# Patient Record
Sex: Female | Born: 1980
Health system: Southern US, Community
[De-identification: ages and names within clinical notes are randomized; demographics above are authoritative.]

## PROBLEM LIST (undated history)

## (undated) ENCOUNTER — Inpatient Hospital Stay (HOSPITAL_COMMUNITY): Payer: Self-pay

## (undated) DIAGNOSIS — H3560 Retinal hemorrhage, unspecified eye: Secondary | ICD-10-CM

## (undated) DIAGNOSIS — O24919 Unspecified diabetes mellitus in pregnancy, unspecified trimester: Secondary | ICD-10-CM

## (undated) DIAGNOSIS — G610 Guillain-Barre syndrome: Secondary | ICD-10-CM

## (undated) DIAGNOSIS — M310 Hypersensitivity angiitis: Secondary | ICD-10-CM

## (undated) DIAGNOSIS — E282 Polycystic ovarian syndrome: Secondary | ICD-10-CM

## (undated) DIAGNOSIS — R739 Hyperglycemia, unspecified: Secondary | ICD-10-CM

## (undated) DIAGNOSIS — Q893 Situs inversus: Secondary | ICD-10-CM

## (undated) HISTORY — DX: Hyperglycemia, unspecified: R73.9

## (undated) HISTORY — DX: Guillain-Barre syndrome: G61.0

## (undated) HISTORY — DX: Retinal hemorrhage, unspecified eye: H35.60

## (undated) HISTORY — DX: Unspecified diabetes mellitus in pregnancy, unspecified trimester: O24.919

## (undated) HISTORY — DX: Polycystic ovarian syndrome: E28.2

## (undated) HISTORY — DX: Hypersensitivity angiitis: M31.0

## (undated) HISTORY — DX: Situs inversus: Q89.3

---

## 1999-02-17 ENCOUNTER — Other Ambulatory Visit: Admission: RE | Admit: 1999-02-17 | Discharge: 1999-02-17 | Payer: Self-pay | Admitting: Obstetrics and Gynecology

## 2000-09-29 ENCOUNTER — Other Ambulatory Visit: Admission: RE | Admit: 2000-09-29 | Discharge: 2000-09-29 | Payer: Self-pay | Admitting: Obstetrics and Gynecology

## 2003-05-13 ENCOUNTER — Other Ambulatory Visit: Admission: RE | Admit: 2003-05-13 | Discharge: 2003-05-13 | Payer: Self-pay | Admitting: Obstetrics and Gynecology

## 2004-06-17 ENCOUNTER — Other Ambulatory Visit: Admission: RE | Admit: 2004-06-17 | Discharge: 2004-06-17 | Payer: Self-pay | Admitting: Obstetrics and Gynecology

## 2004-09-27 ENCOUNTER — Ambulatory Visit: Payer: Self-pay | Admitting: Family Medicine

## 2005-04-19 ENCOUNTER — Ambulatory Visit: Payer: Self-pay | Admitting: Family Medicine

## 2005-04-26 ENCOUNTER — Ambulatory Visit: Payer: Self-pay | Admitting: Family Medicine

## 2005-05-03 ENCOUNTER — Ambulatory Visit: Payer: Self-pay | Admitting: Family Medicine

## 2005-07-01 ENCOUNTER — Other Ambulatory Visit: Admission: RE | Admit: 2005-07-01 | Discharge: 2005-07-01 | Payer: Self-pay | Admitting: Obstetrics and Gynecology

## 2005-08-04 ENCOUNTER — Ambulatory Visit: Payer: Self-pay | Admitting: Family Medicine

## 2005-08-30 ENCOUNTER — Ambulatory Visit: Payer: Self-pay | Admitting: Family Medicine

## 2005-10-04 ENCOUNTER — Ambulatory Visit: Payer: Self-pay | Admitting: Family Medicine

## 2006-02-27 ENCOUNTER — Ambulatory Visit: Payer: Self-pay | Admitting: Family Medicine

## 2006-07-12 ENCOUNTER — Ambulatory Visit: Payer: Self-pay | Admitting: Internal Medicine

## 2006-07-14 ENCOUNTER — Ambulatory Visit: Payer: Self-pay | Admitting: Family Medicine

## 2007-03-21 ENCOUNTER — Emergency Department (HOSPITAL_COMMUNITY): Admission: EM | Admit: 2007-03-21 | Discharge: 2007-03-22 | Payer: Self-pay | Admitting: Emergency Medicine

## 2007-03-22 ENCOUNTER — Telehealth: Payer: Self-pay | Admitting: Family Medicine

## 2007-03-25 ENCOUNTER — Emergency Department (HOSPITAL_COMMUNITY): Admission: EM | Admit: 2007-03-25 | Discharge: 2007-03-25 | Payer: Self-pay | Admitting: Emergency Medicine

## 2007-03-29 ENCOUNTER — Encounter (HOSPITAL_COMMUNITY): Admission: RE | Admit: 2007-03-29 | Discharge: 2007-06-26 | Payer: Self-pay | Admitting: Emergency Medicine

## 2007-04-04 ENCOUNTER — Telehealth: Payer: Self-pay | Admitting: Family Medicine

## 2007-06-04 DIAGNOSIS — J069 Acute upper respiratory infection, unspecified: Secondary | ICD-10-CM | POA: Insufficient documentation

## 2007-06-05 ENCOUNTER — Ambulatory Visit: Payer: Self-pay | Admitting: Family Medicine

## 2007-07-04 ENCOUNTER — Telehealth (INDEPENDENT_AMBULATORY_CARE_PROVIDER_SITE_OTHER): Payer: Self-pay | Admitting: *Deleted

## 2007-07-05 ENCOUNTER — Ambulatory Visit: Payer: Self-pay | Admitting: Family Medicine

## 2007-07-05 DIAGNOSIS — Q893 Situs inversus: Secondary | ICD-10-CM

## 2007-07-05 DIAGNOSIS — R259 Unspecified abnormal involuntary movements: Secondary | ICD-10-CM | POA: Insufficient documentation

## 2007-07-05 DIAGNOSIS — R071 Chest pain on breathing: Secondary | ICD-10-CM

## 2007-07-05 DIAGNOSIS — D1739 Benign lipomatous neoplasm of skin and subcutaneous tissue of other sites: Secondary | ICD-10-CM

## 2007-07-24 ENCOUNTER — Telehealth: Payer: Self-pay | Admitting: Family Medicine

## 2007-08-03 ENCOUNTER — Telehealth (INDEPENDENT_AMBULATORY_CARE_PROVIDER_SITE_OTHER): Payer: Self-pay | Admitting: *Deleted

## 2007-08-07 ENCOUNTER — Telehealth (INDEPENDENT_AMBULATORY_CARE_PROVIDER_SITE_OTHER): Payer: Self-pay | Admitting: *Deleted

## 2007-09-05 ENCOUNTER — Telehealth (INDEPENDENT_AMBULATORY_CARE_PROVIDER_SITE_OTHER): Payer: Self-pay | Admitting: *Deleted

## 2007-09-11 ENCOUNTER — Ambulatory Visit: Payer: Self-pay | Admitting: Cardiology

## 2007-10-01 ENCOUNTER — Ambulatory Visit: Payer: Self-pay | Admitting: Cardiology

## 2007-10-01 ENCOUNTER — Encounter: Payer: Self-pay | Admitting: Cardiology

## 2007-10-01 ENCOUNTER — Ambulatory Visit: Payer: Self-pay

## 2007-10-10 ENCOUNTER — Ambulatory Visit: Payer: Self-pay | Admitting: Family Medicine

## 2007-10-10 DIAGNOSIS — L723 Sebaceous cyst: Secondary | ICD-10-CM | POA: Insufficient documentation

## 2007-10-12 ENCOUNTER — Ambulatory Visit (HOSPITAL_COMMUNITY): Admission: RE | Admit: 2007-10-12 | Discharge: 2007-10-12 | Payer: Self-pay | Admitting: Obstetrics and Gynecology

## 2007-12-13 ENCOUNTER — Ambulatory Visit: Payer: Self-pay | Admitting: Family Medicine

## 2008-05-28 ENCOUNTER — Telehealth: Payer: Self-pay | Admitting: Family Medicine

## 2008-10-06 ENCOUNTER — Ambulatory Visit: Payer: Self-pay | Admitting: Family Medicine

## 2008-10-06 DIAGNOSIS — L708 Other acne: Secondary | ICD-10-CM

## 2008-10-06 DIAGNOSIS — J309 Allergic rhinitis, unspecified: Secondary | ICD-10-CM | POA: Insufficient documentation

## 2008-10-08 ENCOUNTER — Telehealth: Payer: Self-pay | Admitting: Family Medicine

## 2008-10-17 ENCOUNTER — Telehealth: Payer: Self-pay | Admitting: Family Medicine

## 2008-12-31 ENCOUNTER — Telehealth: Payer: Self-pay | Admitting: Cardiology

## 2009-01-30 ENCOUNTER — Ambulatory Visit: Payer: Self-pay | Admitting: Cardiology

## 2009-01-30 DIAGNOSIS — Q249 Congenital malformation of heart, unspecified: Secondary | ICD-10-CM

## 2009-02-09 ENCOUNTER — Ambulatory Visit (HOSPITAL_COMMUNITY): Admission: RE | Admit: 2009-02-09 | Discharge: 2009-02-09 | Payer: Self-pay | Admitting: Obstetrics and Gynecology

## 2009-03-20 ENCOUNTER — Telehealth: Payer: Self-pay | Admitting: Cardiology

## 2009-03-30 ENCOUNTER — Ambulatory Visit (HOSPITAL_COMMUNITY): Admission: RE | Admit: 2009-03-30 | Discharge: 2009-03-30 | Payer: Self-pay | Admitting: Obstetrics and Gynecology

## 2009-04-28 ENCOUNTER — Ambulatory Visit (HOSPITAL_COMMUNITY): Admission: RE | Admit: 2009-04-28 | Discharge: 2009-04-28 | Payer: Self-pay | Admitting: Obstetrics and Gynecology

## 2009-06-09 ENCOUNTER — Encounter: Admission: RE | Admit: 2009-06-09 | Discharge: 2009-08-19 | Payer: Self-pay | Admitting: Obstetrics and Gynecology

## 2009-06-11 ENCOUNTER — Ambulatory Visit: Payer: Self-pay | Admitting: Cardiology

## 2009-06-28 ENCOUNTER — Observation Stay (HOSPITAL_COMMUNITY): Admission: AD | Admit: 2009-06-28 | Discharge: 2009-06-29 | Payer: Self-pay | Admitting: Obstetrics and Gynecology

## 2009-07-02 ENCOUNTER — Ambulatory Visit: Payer: Self-pay | Admitting: Family Medicine

## 2009-07-02 ENCOUNTER — Telehealth: Payer: Self-pay | Admitting: Family Medicine

## 2009-07-14 ENCOUNTER — Telehealth: Payer: Self-pay | Admitting: Family Medicine

## 2009-07-17 ENCOUNTER — Ambulatory Visit (HOSPITAL_COMMUNITY): Admission: RE | Admit: 2009-07-17 | Discharge: 2009-07-17 | Payer: Self-pay | Admitting: Obstetrics and Gynecology

## 2009-07-19 ENCOUNTER — Inpatient Hospital Stay (HOSPITAL_COMMUNITY): Admission: AD | Admit: 2009-07-19 | Discharge: 2009-07-19 | Payer: Self-pay | Admitting: Obstetrics and Gynecology

## 2009-08-04 ENCOUNTER — Ambulatory Visit: Payer: Self-pay | Admitting: Cardiology

## 2009-08-11 ENCOUNTER — Inpatient Hospital Stay (HOSPITAL_COMMUNITY): Admission: AD | Admit: 2009-08-11 | Discharge: 2009-08-12 | Payer: Self-pay | Admitting: Obstetrics and Gynecology

## 2009-08-20 ENCOUNTER — Inpatient Hospital Stay (HOSPITAL_COMMUNITY): Admission: RE | Admit: 2009-08-20 | Discharge: 2009-08-23 | Payer: Self-pay | Admitting: Obstetrics and Gynecology

## 2009-08-24 ENCOUNTER — Encounter: Admission: RE | Admit: 2009-08-24 | Discharge: 2009-09-23 | Payer: Self-pay | Admitting: Obstetrics & Gynecology

## 2009-09-24 ENCOUNTER — Encounter: Admission: RE | Admit: 2009-09-24 | Discharge: 2009-10-21 | Payer: Self-pay | Admitting: Obstetrics and Gynecology

## 2009-10-22 ENCOUNTER — Encounter: Admission: RE | Admit: 2009-10-22 | Discharge: 2009-11-21 | Payer: Self-pay | Admitting: Obstetrics and Gynecology

## 2009-11-22 ENCOUNTER — Encounter: Admission: RE | Admit: 2009-11-22 | Discharge: 2009-12-22 | Payer: Self-pay | Admitting: Obstetrics and Gynecology

## 2009-12-23 ENCOUNTER — Encounter: Admission: RE | Admit: 2009-12-23 | Discharge: 2010-01-22 | Payer: Self-pay | Admitting: Obstetrics and Gynecology

## 2010-01-07 ENCOUNTER — Telehealth: Payer: Self-pay | Admitting: Family Medicine

## 2010-01-23 ENCOUNTER — Encounter: Admission: RE | Admit: 2010-01-23 | Discharge: 2010-02-18 | Payer: Self-pay | Admitting: Obstetrics and Gynecology

## 2010-02-03 ENCOUNTER — Encounter: Payer: Self-pay | Admitting: *Deleted

## 2010-02-23 ENCOUNTER — Encounter: Admission: RE | Admit: 2010-02-23 | Discharge: 2010-03-25 | Payer: Self-pay | Admitting: Obstetrics and Gynecology

## 2010-02-24 IMAGING — US US FETAL BPP W/O NONSTRESS
1 series · 5 of 5 positions shown · non-contrast
Comparison: none

OBSTETRICAL ULTRASOUND:
 This ultrasound exam was performed in the [HOSPITAL] Ultrasound Department.  The OB US report was generated in the AS system, and faxed to the ordering physician.  This report is also available in [HOSPITAL]?s AccessANYware and in [REDACTED] PACS.

[Series 1: us fetal bpp w/o nonstress · non-contrast · 5 acquisitions, 5 frames shown]
[im 1/5]
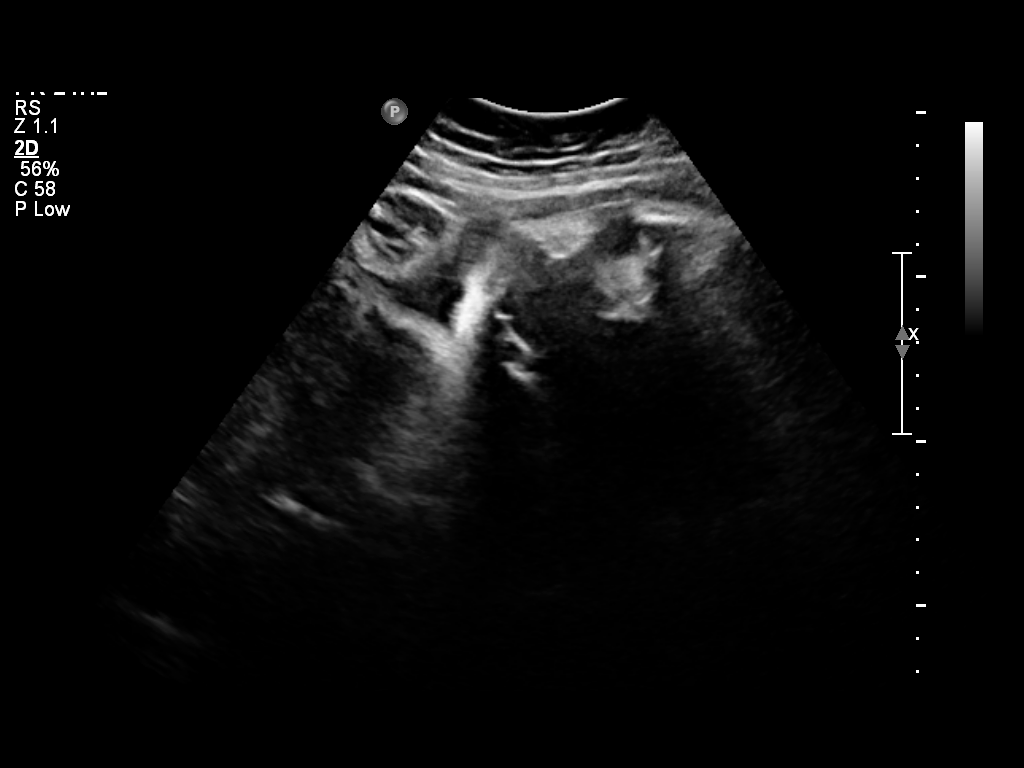
[im 2/5]
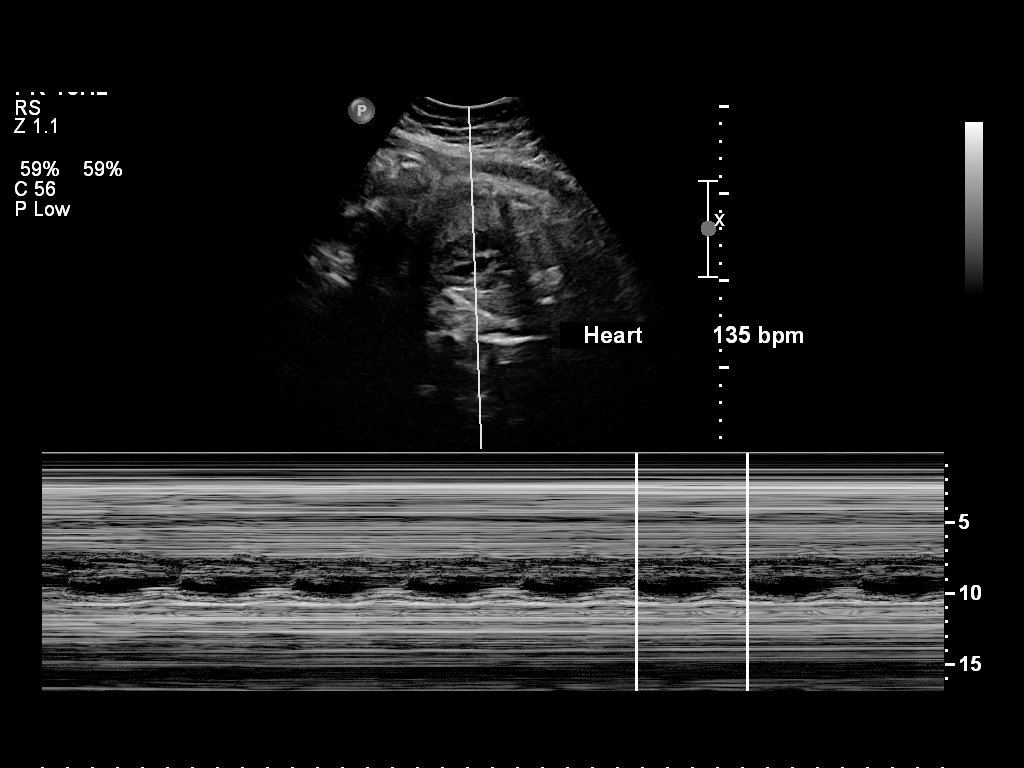
[im 3/5]
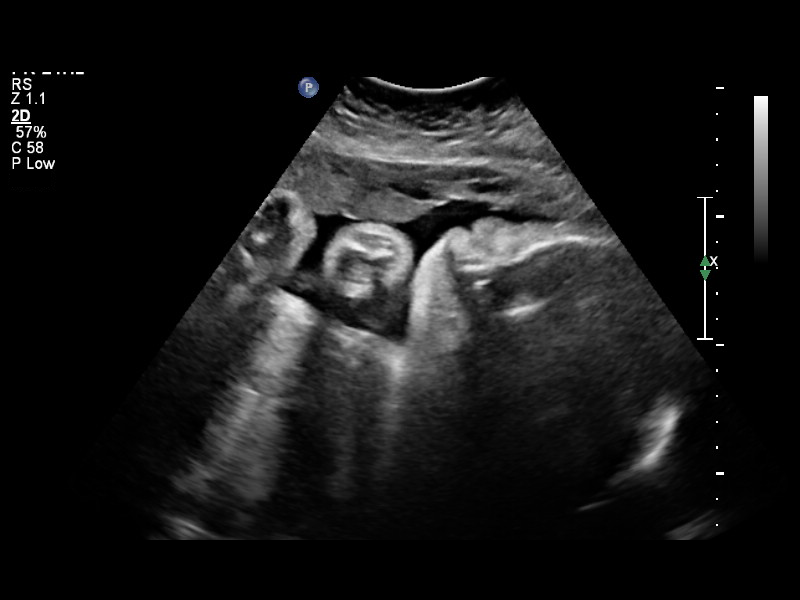
[im 4/5]
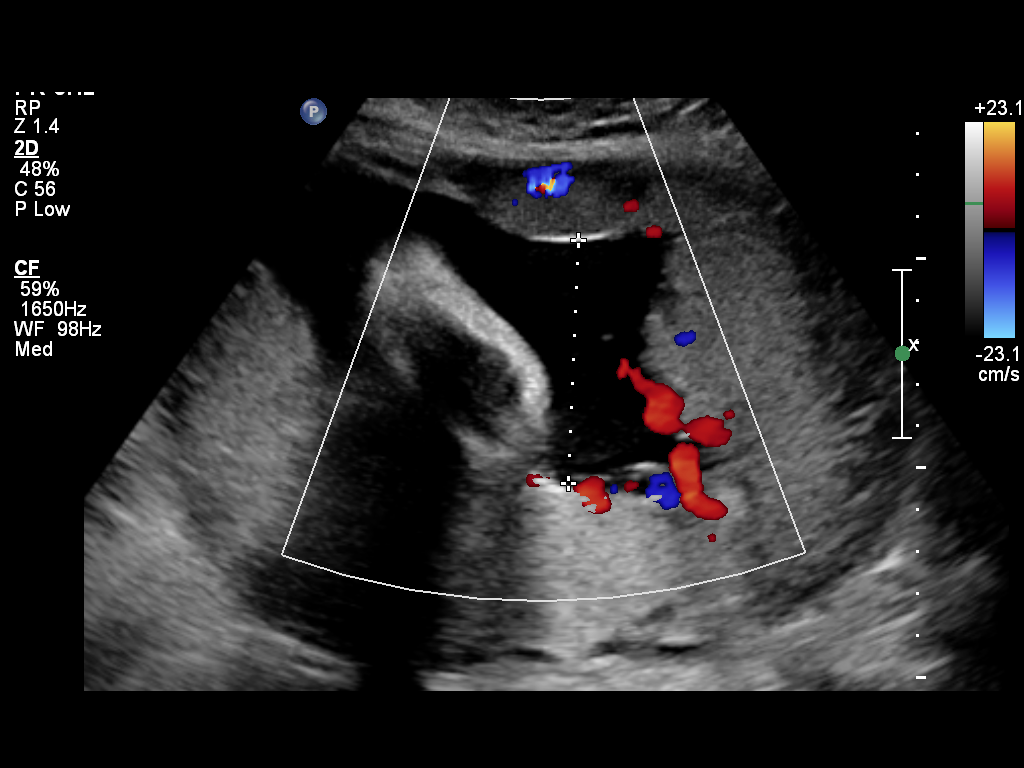
[im 5/5]
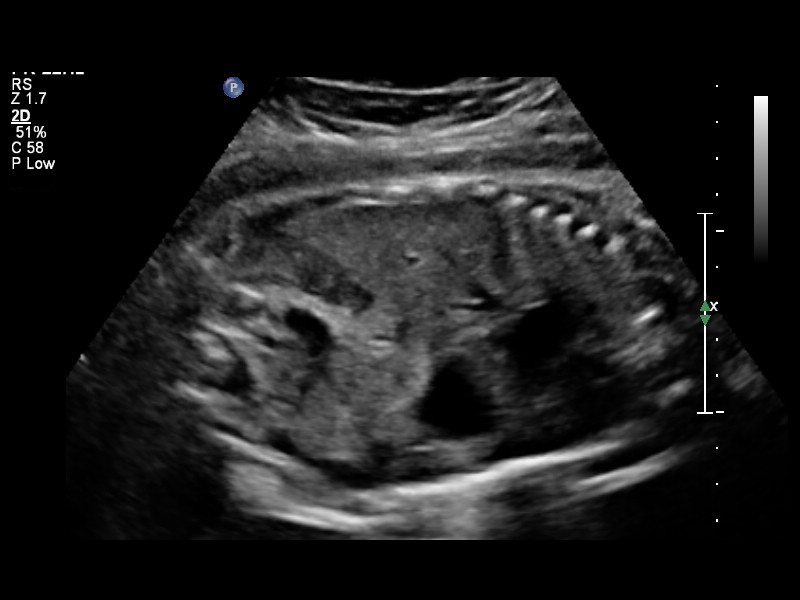

[5 of 5 positions shown; findings below may reference images not displayed]

IMPRESSION: See AS Obstetric US report.

## 2010-03-26 ENCOUNTER — Encounter: Admission: RE | Admit: 2010-03-26 | Discharge: 2010-04-25 | Payer: Self-pay | Admitting: Obstetrics and Gynecology

## 2010-04-26 ENCOUNTER — Encounter: Admission: RE | Admit: 2010-04-26 | Discharge: 2010-05-21 | Payer: Self-pay | Admitting: Obstetrics and Gynecology

## 2010-05-14 ENCOUNTER — Ambulatory Visit: Payer: Self-pay | Admitting: Internal Medicine

## 2010-05-14 ENCOUNTER — Telehealth: Payer: Self-pay | Admitting: Cardiology

## 2010-05-14 DIAGNOSIS — R002 Palpitations: Secondary | ICD-10-CM | POA: Insufficient documentation

## 2010-05-27 ENCOUNTER — Encounter
Admission: RE | Admit: 2010-05-27 | Discharge: 2010-06-26 | Payer: Self-pay | Source: Home / Self Care | Admitting: Obstetrics and Gynecology

## 2010-06-27 ENCOUNTER — Encounter
Admission: RE | Admit: 2010-06-27 | Discharge: 2010-07-27 | Payer: Self-pay | Source: Home / Self Care | Admitting: Obstetrics and Gynecology

## 2010-07-28 ENCOUNTER — Encounter
Admission: RE | Admit: 2010-07-28 | Discharge: 2010-08-16 | Payer: Self-pay | Source: Home / Self Care | Attending: Obstetrics and Gynecology | Admitting: Obstetrics and Gynecology

## 2010-09-13 ENCOUNTER — Encounter: Payer: Self-pay | Admitting: Obstetrics and Gynecology

## 2010-09-21 NOTE — Miscellaneous (Signed)
Summary: immunization update  Clinical Lists Changes  Observations: Added new observation of MMR #2: Historical (01/16/1998 11:18) Added new observation of OPV #4: Historical (04/14/1986 11:18) Added new observation of DPT #5: Historical (04/14/1986 11:18) Added new observation of HEMINFB#1: Historical (03/21/1986 11:18) Added new observation of DPT #4: Historical (11/07/1981 11:18) Added new observation of OPV #3: Historical (11/05/1981 11:18) Added new observation of MMR #1: Historical (08/20/1981 11:18) Added new observation of DPT #3: Historical (10/20/1980 11:18) Added new observation of OPV #2: Historical (07/31/1980 11:18) Added new observation of DPT #2: Historical (07/31/1980 11:18) Added new observation of OPV #1: Historical (06/17/1980 11:18) Added new observation of DPT #1: Historical (06/17/1980 11:18)      Immunization History:  DPT Immunization History:    DPT # 1:  historical (06/17/1980)    DPT # 2:  historical (07/31/1980)    DPT # 3:  historical (10/20/1980)    DPT # 4:  historical (11/07/1981)    DPT # 5:  historical (04/14/1986)  HIB Immunization History:    HIB # 1:  historical (03/21/1986)  Polio Immunization History:    Polio # 1:  historical (06/17/1980)    Polio # 2:  historical (07/31/1980)    Polio # 3:  historical (11/05/1981)    Polio # 4:  historical (04/14/1986)  MMR Immunization History:    MMR # 1:  historical (08/20/1981)    MMR # 2:  historical (01/16/1998)

## 2010-09-21 NOTE — Progress Notes (Signed)
Summary: breastfeeding  Phone Note Call from Patient   Caller: Patient Call For: Roderick Pee MD Summary of Call: Pt. wants to know if it is ok for her to use Nasonex while breastfeeding? [phone] Initial call taken by: Lynann Beaver CMA,  Jan 07, 2010 10:35 AM  Follow-up for Phone Call        ok Follow-up by: Roderick Pee MD,  Jan 07, 2010 10:49 AM  Additional Follow-up for Phone Call Additional follow up Details #1::        Pt. notified. Additional Follow-up by: Lynann Beaver CMA,  Jan 07, 2010 10:55 AM

## 2010-09-21 NOTE — Progress Notes (Signed)
Summary: new rx  Phone Note Call from Patient   Summary of Call: patient is calling because the rx for nasonex is too costly.  she would like to know if there is a safe generic that she can try.  [facility] Initial call taken by: Kern Reap CMA Duncan Dull),  Jan 07, 2010 3:25 PM  Follow-up for Phone Call        Flonase nasal spray, dispensed, one use as directed 6 refills Follow-up by: Roderick Pee MD,  Jan 07, 2010 3:26 PM    New/Updated Medications: FLONASE 50 MCG/ACT SUSP (FLUTICASONE PROPIONATE) use as directed Prescriptions: FLONASE 50 MCG/ACT SUSP (FLUTICASONE PROPIONATE) use as directed  #1 x 6   Entered by:   Kern Reap CMA (AAMA)   Authorized by:   Roderick Pee MD   Signed by:   Kern Reap CMA (AAMA) on 01/07/2010   Method used:   Electronically to        [facility]. [phone]* (retail)       [address]       [place], [place]  [address]       Ph: [phone]       Fax: [phone]   RxID:   [id number]

## 2010-09-21 NOTE — Progress Notes (Signed)
Summary: irregular heart beat  Phone Note Call from Patient   Caller: Patient Reason for Call: Talk to Nurse Complaint: Chest Pain Summary of Call: pt wanting to be seen today for heavy chest, denies chest pains, nausea and dizziness, has a headache, some sob for about 30 min pls advise [phone] Initial call taken by: Glynda Jaeger,  May 14, 2010 8:16 AM  Follow-up for Phone Call        Woke this am with irregular heart beat  lasted about an hour - fluttering lead to a headache   feeling some better.  has happened before when pregnant but hasnt happened in a while. In bed but didnt feel dizzy.  "felt like it was working triple time"  wasn't intense the whole time but felt like she was having trouble getting a deep breath. Follow-up by: Charolotte Capuchin, RN,  May 14, 2010 9:21 AM  Additional Follow-up for Phone Call Additional follow up Details #1::        Patient would need to be worked into the office or if no room go to the ER or urgent care.  I would suggest the latter for EKG if the office is not available.  pt will see Dr Dietrich Pates at 3:15pm  Sander Nephew, RN Additional Follow-up by: Rollene Rotunda, MD, [facility],  May 14, 2010 12:25 PM

## 2010-09-21 NOTE — Assessment & Plan Note (Signed)
Summary: add on per [facility] /jr   Primary Provider:  Roderick Pee MD   History of Present Illness: patient is a 30 year old who is usually followed by J Hochrein.  She has a history of situs inversus She wolke oup this monrning with palpitations.  She has had them in the past, transient.  This morning they felt like isolated skips but they occurred intermittently for 1 hour.  Got up.  Walked around.  No dizziness.  Felt a little heaviness, like her heart was beeting harder.  Since then has felt ok. Denies any excess caffeine.  Getting sleep.  Is active.  No problems with SOB.  No signif palp otherwise.  Did have some during pregnancy.  Current Medications (verified): 1)  Birth Control .... Daily  Allergies (verified): 1)  ! Pcn 2)  ! Amoxicillin 3)  ! Sulfa  Past History:  Past medical, surgical, family and social histories (including risk factors) reviewed, and no changes noted (except as noted below).  Past Medical History: Reviewed history from 06/11/2009 and no changes required. 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  Leukocytoclastic vasculitis.  Hyperglycemia Gestational diabetes  Past Surgical History: Reviewed history from 01/29/2009 and no changes required. None  Family History: Reviewed history from 01/29/2009 and no changes required. Family History of Alcoholism/Addiction Family History of Arthritis Family History Diabetes 1st degree relative Family History Psychiatric care Family History of Stroke M 1st degree relative <50 Family History of Cardiovascular disorder  Social History: Reviewed history from 01/29/2009 and no changes required. Occupation: Never Smoked Alcohol use-no Drug use-no Regular exercise-no Married  Review of Systems       All systems reviewd.  Neg to the above prblem.  Vital Signs:  Patient profile:   30 year old female Height:      65 inches Weight:      186 pounds BMI:      31.06 Pulse rate:   96 / minute Resp:     16 per minute BP sitting:   112 / 68  (left arm)  Vitals Entered By: Marrion Coy, CNA (May 14, 2010 3:44 PM)  Physical Exam  Additional Exam:  Patient in NAD HEENT:  Normocephalic, atraumatic. EOMI, PERRLA.  Neck: JVP is normal. No thyromegaly. No bruits.  Lungs: clear to auscultation. No rales no wheezes.  Heart: Regular rate and rhythm. Normal S1, S2. No S3.   No significant murmurs. PMI/sounds on R side.  Abdomen:  Supple, nontender. Normal bowel sounds. No masses. No hepatomegaly.  Extremities:   Good distal pulses throughout. No lower extremity edema.  Musculoskeletal :moving all extremities.  Neuro:   alert and oriented x3.    Impression & Recommendations:  Problem # 1:  PALPITATIONS (ICD-785.1) patient with isolated palpitations earlier today that came on freq durng 1 hour.  NOne since.  Did not appear to be hemodyn destabilizing.   I reassured patient.  I am not worried that they represent a significant arrhythmia. I would continue activities as tolerated.  If she has a recurrence would set her up for an event monitor to capture.

## 2010-11-22 LAB — CBC
Hemoglobin: 10.7 g/dL — ABNORMAL LOW (ref 12.0–15.0)
Hemoglobin: 12.8 g/dL (ref 12.0–15.0)
RBC: 3.64 MIL/uL — ABNORMAL LOW (ref 3.87–5.11)
RBC: 4.4 MIL/uL (ref 3.87–5.11)
WBC: 14.8 10*3/uL — ABNORMAL HIGH (ref 4.0–10.5)
WBC: 15.9 10*3/uL — ABNORMAL HIGH (ref 4.0–10.5)

## 2010-11-22 LAB — GLUCOSE, CAPILLARY: Glucose-Capillary: 100 mg/dL — ABNORMAL HIGH (ref 70–99)

## 2010-11-22 LAB — RPR: RPR Ser Ql: NONREACTIVE

## 2010-11-24 LAB — URINALYSIS, ROUTINE W REFLEX MICROSCOPIC
Glucose, UA: NEGATIVE mg/dL
Specific Gravity, Urine: 1.01 (ref 1.005–1.030)
Urobilinogen, UA: 0.2 mg/dL (ref 0.0–1.0)
pH: 7 (ref 5.0–8.0)

## 2010-11-24 LAB — DIFFERENTIAL
Eosinophils Absolute: 0 10*3/uL (ref 0.0–0.7)
Lymphs Abs: 1.5 10*3/uL (ref 0.7–4.0)
Neutrophils Relative %: 83 % — ABNORMAL HIGH (ref 43–77)

## 2010-11-24 LAB — CBC
MCV: 85.7 fL (ref 78.0–100.0)
Platelets: 253 10*3/uL (ref 150–400)
WBC: 13.2 10*3/uL — ABNORMAL HIGH (ref 4.0–10.5)

## 2010-11-24 LAB — GLUCOSE, CAPILLARY: Glucose-Capillary: 95 mg/dL (ref 70–99)

## 2010-11-24 LAB — URINE MICROSCOPIC-ADD ON

## 2010-11-29 LAB — ANA: Anti Nuclear Antibody(ANA): NEGATIVE

## 2010-11-29 LAB — LUPUS ANTICOAGULANT PANEL
DRVVT: 43.8 secs (ref 36.1–47.0)
Lupus Anticoagulant: NOT DETECTED

## 2010-11-29 LAB — CARDIOLIPIN ANTIBODIES, IGG, IGM, IGA: Anticardiolipin IgG: <7 [GPL'U] — ABNORMAL LOW (ref <11)

## 2010-12-20 ENCOUNTER — Encounter: Payer: Self-pay | Admitting: Family Medicine

## 2010-12-20 ENCOUNTER — Ambulatory Visit (INDEPENDENT_AMBULATORY_CARE_PROVIDER_SITE_OTHER): Payer: BC Managed Care – PPO | Admitting: Family Medicine

## 2010-12-20 VITALS — BP 110/70 | Temp 98.7°F | Ht 65.0 in | Wt 215.0 lb

## 2010-12-20 DIAGNOSIS — J301 Allergic rhinitis due to pollen: Secondary | ICD-10-CM | POA: Insufficient documentation

## 2010-12-20 MED ORDER — FLUTICASONE PROPIONATE 50 MCG/ACT NA SUSP: 1.0000 | Freq: Every day | NASAL | Status: DC

## 2010-12-20 NOTE — Patient Instructions (Signed)
Begin Zyrtec, plain, 10 mg nightly, and one shot of the steroid nasal spray up each nostril at bedtime.  If in two to 3 weeks.  She does see much improvement.  Call and we will e-mail a short course of the prednisone tablets

## 2010-12-20 NOTE — Progress Notes (Signed)
  Subjective:    Patient ID: Susan Andrade, female    DOB: 1980-12-06, 30 y.o.   MRN: [id number]  HPI  Susan Andrade is a 30 year old female, married, nonsmoker G1, P1, who comes in today for evaluation of allergic rhinitis.  She has perennial allergic rhinitis that typically is worse in the fall and spring.  She's taken OTC Claritin now.  Her symptoms are gotten worse.  Head congestion, postnasal drip, cough, no wheezing    Review of Systems    General and immunologic view is systems otherwise negative Objective:   Physical Exam    Well-developed well-nourished, in no acute distress.  HEENT negative except for 3+ nasal edema.  Neck was supple.  No adenopathy.  Lungs are clear    Assessment & Plan:  Allergic rhinitis,,,,,,,,,,, switch to Zyrtec 10 mg plane nightly also, steroid nasal spray

## 2011-01-04 NOTE — Assessment & Plan Note (Signed)
[facility]                                 ON-CALL NOTE   NAME:Susan, Andrade                           MRN:          [id number]  DATE:03/21/2007                            DOB:          1981-06-25    TIME:  9:52 p.m.   PHONE NUMBER:  [phone], caller was Derrel Nip, the mother.   OBJECTIVE:  The patient is unsure what, but had something fly in her  face, and was told that it was fluttering like a butterfly.  They  presumed it was probably a bat, I am not sure where she was, but  everyone seems to think that it probably was a bat that flew into her  face.  She is unable to tell whether she was bitten or not.   ASSESSMENT:  Possible interaction with a bat.   PLAN:  Would suggest she go to the emergency room and try and get this  sorted out.  May very well need to call [facility] [employer] to see what  they suggest, but I am sure that if they feel she has had an interaction  with a bat, will probably needs rabies prophylaxis.  The patient has had  Guillain-Barre and vasculitis which compounds the problem, but probably  will not change the outcome.  Again, I think the emergency room would be  able to sort this out.   PRIMARY CARE [place] Maclellan:  Dr. Tawanna Cooler.   HOME OFFICE:  [facility].     Susan Silence, Susan Andrade  Electronically Signed    RNS/MedQ  DD: 03/21/2007  DT: 03/22/2007  Job #: [id number]

## 2011-01-04 NOTE — Procedures (Signed)
[facility]                              EXERCISE TREADMILL   Susan Andrade, Susan Andrade                       MRN:          [id number]  DATE:10/01/2007                            DOB:          [birth year]    PRIMARY CARE PHYSICIAN:  Tinnie Gens A. Tawanna Cooler, MD   PROCEDURE:  Exercise treadmill test.   INDICATION:  Evaluate patient with atypical chest discomfort and situs  inversus.   PROCEDURE NOTE:  The patient was exercised using standard Bruce  protocol.  We did hook the leads up to the right side, given her situs  inversus.  There ws some difficulty in tacking blood pressure.  Her  blood pressures were somewhat fluctuating during the exam and I did not  think were entirely accurate.  However, I wanted to complete the  examination and follow the heart rate and EKG changes.  Patient was  exercised using standard Bruce protocol.  She was able to exercise for 9  minutes.  This completed stage 3.  She achieved 10.4 METS.  She had a  peak heart rate of 193, which was 100% of predicted.  It was stopped  because she had achieved target heart rate, we had difficulty following  the blood pressures and she had fatigue.  She denied any chest pain.  She had appropriate shortness of breath.  She had no ischemic ST-T wave  changes.  There were no arrhythmias.  She had a normal heart rate  recovery.  Of note, she did start with some T-wave inversion in her  anterior leads.  This had been noticed on previous EKGs.  Her blood  pressure again fluctuated somewhat and may have been slightly  unreliable.   CONCLUSION:  Negative adequate exercise treadmill test with a moderately  poor exercise tolerance, although she was able to complete stage 3.  There were no ischemic ST-T wave changes.  Her symptoms are very  atypical.  The blood pressure recordings were not helpful.  I would find  it very low probability of her having any obstructive coronary disease  given this result.   PLAN:  1. At this point I am waiting for the results of an echocardiogram.  I      have given her specific instructions for exercise program.  2. Followup.  I will see the patient again as needed.  She probably      ought to check in every couple of years in this clinic or sooner if      she has any symptoms.     Rollene Rotunda, MD, [facility]  Electronically Signed    JH/MedQ  DD: 10/01/2007  DT: 10/02/2007  Job #: [id number]   cc:   Tinnie Gens A. Tawanna Cooler, MD

## 2011-01-04 NOTE — Assessment & Plan Note (Signed)
[facility]                            CARDIOLOGY OFFICE NOTE   Andrade, Susan                       MRN:          [id number]  DATE:09/11/2007                            DOB:          Apr 03, 1981    PRIMARY CARE PHYSICIAN:  Dr. Kelle Darting.   REASON FOR PRESENTATION:  Evaluate patient with congenital heart  disease.   HISTORY OF PRESENT ILLNESS:  The patient is a pleasant 30 year old white  female with a known history of situs inversus.  She has a diagnosis of  situs inversus totalis.  I have limited records.  However, she is quite  intelligent and has researched this over her lifetime.  She does not  remember what kind of scans she has had, but she has had none that she  can recall in her adolescence or adulthood.  She has had no  complications related to this, and has not needed to follow with  cardiologists.  However, she presented to Dr. Tawanna Cooler to discuss this.  She  has had some symptoms.  She occasionally will get some sharp or  catching discomfort under her right breast.  This happens a few times  a month.  It is sporadic.  It does not happen with activity.  It goes  away spontaneously after a few minutes.  It seems to be a stable  pattern.  She does not describe substernal chest pressure, neck or arm  discomfort.  She does not have any shortness of breath, and denies any  PND or orthopnea.  She will occasionally get a sensation at night as if  she needs to take a deep breath or that her heart is skipping.   She had a normal childhood and adolescence.  She is not overly active,  though she played soccer, and she still rides horses.  With this  activity, she denies any limitations or symptoms.  Patient does have a  question of mitral valve prolapse, and has had a heart murmur diagnosed  in the past.   PAST MEDICAL HISTORY:  1. Guillain-Barre disease.  2. Leukocytoclastic vasculitis.   PAST SURGICAL HISTORY:  None.   ALLERGIES:  1.  PENICILLIN.  2. AMOXICILLIN.  3. SULFA.   MEDICATIONS:  Claritin, YAZ.   SOCIAL HISTORY:  The patient is recently married.  She works in Anim[employer].  She has no children.  She does not smoke cigarettes, and  never has.  She rarely drinks alcohol.   FAMILY HISTORY:  Noncontributory for sudden cardiac death, congenital  heart disease, coronary artery disease.   REVIEW OF SYSTEMS:  As stated in the HPI, otherwise negative for other  systems, except for cough.  She has this every morning.  She says it is  productive of some yellow sputum.  She feels like she has sinus  drainage.  She has occasional diarrhea.  She has had difficulty losing  weight.   PHYSICAL EXAMINATION:  The patient is in no distress.  Blood pressure 125/87.  Heart rate 97 and regular.  Body mass index 32.  HEENT:  Eyes unremarkable.  Pupils  equal, round, and reactive to light.  Fundi within normal limits.  Oral mucosa unremarkable.  NECK:  No jugular venous distention at 45 degrees.  Carotid upstroke  brisk and symmetric.  No bruits.  No thyromegaly.  LYMPHATICS:  No cervical, axillary, or inguinal adenopathy.  LUNGS:  Clear to auscultation bilaterally.  BACK:  No costovertebral angle tenderness.  CHEST:  Her PMI is on the right side, and not displaced.  S1 and S2  within normal limits.  No S3.  No S4.  No clicks.  No rubs.  A 2/6 brief  murmur at the apex.  No diastolic murmurs.  ABDOMEN:  Flat.  Positive bowel sounds.  Normal in frequency and pitch.  No bruits.  No rebound.  No guarding.  No midline pulsatile mass.  No  hepatomegaly.  No splenomegaly.  SKIN:  No rashes.  No nodules.  EXTREMITIES:  Two plus pulses throughout.  No edema.  No cyanosis.  No  clubbing.  NEUROLOGIC:  Oriented to person, place, and time.  Cranial nerves 2  through 12 grossly intact.  Motor grossly intact.   EKG:  (With all leads reversed).  Sinus rhythm with sinus arrhythmia,  left axis deviation.  No acute ST-T wave  changes.   ASSESSMENT AND PLAN:  1. Chest discomfort.  The patient has an atypical chest discomfort.      However, I would like to get her in an exercise regimen, and will      therefore put her on an exercise treadmill.  This will rule out      coronary disease with a reasonable certainty.  Risk stratify most      importantly allow me to give her a prescription for exercise.  2. Situs inversus.  I am going to see if she has any other records of      this.  We will get an echocardiogram to further rule out any      structural heart disease, though I suspect none.  If indeed the      above 2 studies are normal, then no further cardiovascular testing      would be warranted.  3. Sinus drainage.  The patient has chronic drainage and cough.  I      have suggested an ENT evaluation, and doubt that this is syndromic.  4. Weight.  We had a long about the way to lose weight with diet and      exercise, and I will give her more instruction at the time of her      treadmill.  5. Followup.  I will see her at the time of her treadmill and      ultrasound.    Andrade Rotunda, MD, [facility]  Electronically Signed   JH/MedQ  DD: 09/11/2007  DT: 09/11/2007  Job #: [id number]   cc:   Tinnie Gens A. Tawanna Cooler, MD

## 2011-01-07 NOTE — Letter (Signed)
June 06, 2006    Barbarann Ehlers  [address]  [place], [place] [address]   RE:  [name], [name]  MRN:  [id number]  /  DOB:  [birth year]   To whom it may concern:   This letter is concerning Ms. Susan Andrade.  Date of birth 1981/01/07.  [facility] Chart#:  [id number].   Ms. Susan Andrade is a 30 year old female whom I have seen for routine medical care  since 2004.   The patient is in excellent health.  She has had no previously  hospitalizations, no outpatient surgery, except for dental extractions.  She  did have Guillain-Barre treated by Dr. Sandria Manly that resolved spontaneously with  no hospitalization.  Personal habits are excellent.  She does not smoke, or  drink any alcohol.   SOCIAL HISTORY:  She is originally form [place], [place].  Went to  [employer] and then [employer], graduated in 2003 with a psychology degree.  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   REVIEW OF SYSTEMS:  The patient also was noted in 2004 to have a vasculitis  secondary to an insect bite.  This resolved spontaneously.  She did have to  take some steroids and, at 1 point in time, she saw a dermatologist here,  Dr. Amy Swaziland, and also a dermatologist in [place], Dr. Reche Dixon.  She did  well.  Had no sequelae from that.  We have seen her here for routine medical  care.  She has seen a gynecologist for routine check-ups, Pap smears, et  Karie Soda.  She has had no major medical problems.   SUMMARY:  This young lady is in excellent health.  She has no chronic health  problems.  She takes good care of herself.  She has no bad health habits.  From an insurance standpoint, she should not have any ratings placed on her  based on the previous history.  These were problems that resolved  spontaneously.  They will not affect her future health in any way.   If you have any questions or concerns, please feel free to call me at [phone]    Sincerely,     ______________________________  Eugenio Hoes. Tawanna Cooler, MD    JAT/MedQ  /  Job #:  [id number]  DD:  06/06/2006 / DT:  06/07/2006

## 2011-01-19 ENCOUNTER — Telehealth: Payer: Self-pay | Admitting: *Deleted

## 2011-01-19 NOTE — Telephone Encounter (Signed)
Pt has had a GI virus, and is better, but her food is still giving her diarrhea.  Would like a Probiotic called to [facility].

## 2011-01-20 NOTE — Telephone Encounter (Signed)
Left message on machine for patient

## 2011-01-20 NOTE — Telephone Encounter (Signed)
Tell patient to stay on a fat-free diet.  No milk ice cream cheese dairy products fried foods, fatty foods, and no lactose and her diet will get better on some

## 2011-04-12 ENCOUNTER — Telehealth: Payer: Self-pay | Admitting: Family Medicine

## 2011-04-12 NOTE — Telephone Encounter (Signed)
patient  Requests a lab for lipids and glucose.  Appointment made.

## 2011-04-12 NOTE — Telephone Encounter (Signed)
Fleet Contras please call and find, out what's going on

## 2011-04-12 NOTE — Telephone Encounter (Signed)
Pt was seen by Dr Harrington Challenger yesterday at [facility] and was told to schedule lab work with her primary. Pt called to schedule appt. What labs does she need? Please advise.

## 2011-04-19 ENCOUNTER — Other Ambulatory Visit (INDEPENDENT_AMBULATORY_CARE_PROVIDER_SITE_OTHER): Payer: BC Managed Care – PPO | Admitting: Family Medicine

## 2011-04-19 DIAGNOSIS — E785 Hyperlipidemia, unspecified: Secondary | ICD-10-CM

## 2011-04-19 DIAGNOSIS — R7309 Other abnormal glucose: Secondary | ICD-10-CM

## 2011-04-19 LAB — LIPID PANEL
Cholesterol: 194 mg/dL (ref 0–200)
LDL Cholesterol: 136 mg/dL — ABNORMAL HIGH (ref 0–99)
Total CHOL/HDL Ratio: 5

## 2011-04-19 LAB — GLUCOSE, POCT (MANUAL RESULT ENTRY): POC Glucose: 96

## 2011-04-27 ENCOUNTER — Telehealth: Payer: Self-pay | Admitting: Family Medicine

## 2011-04-27 NOTE — Telephone Encounter (Signed)
Pt requesting results of lab work.

## 2011-05-02 NOTE — Telephone Encounter (Signed)
Okay to call lab work report.

## 2011-05-02 NOTE — Telephone Encounter (Signed)
Pt would like labwork results °

## 2011-05-03 ENCOUNTER — Ambulatory Visit: Payer: BC Managed Care – PPO | Admitting: Family Medicine

## 2011-05-03 NOTE — Telephone Encounter (Signed)
Left message on machine for patient

## 2011-05-03 NOTE — Telephone Encounter (Signed)
Pt called and requested is she doesn't answer to please leave her a message with the results of her lab.

## 2011-08-01 ENCOUNTER — Ambulatory Visit (INDEPENDENT_AMBULATORY_CARE_PROVIDER_SITE_OTHER): Payer: BC Managed Care – PPO | Admitting: Family Medicine

## 2011-08-01 ENCOUNTER — Encounter: Payer: Self-pay | Admitting: Family Medicine

## 2011-08-01 VITALS — BP 122/80 | HR 85 | Temp 98.7°F | Ht 65.0 in | Wt 220.8 lb

## 2011-08-01 DIAGNOSIS — E669 Obesity, unspecified: Secondary | ICD-10-CM

## 2011-08-01 DIAGNOSIS — Z8742 Personal history of other diseases of the female genital tract: Secondary | ICD-10-CM | POA: Insufficient documentation

## 2011-08-01 DIAGNOSIS — O99810 Abnormal glucose complicating pregnancy: Secondary | ICD-10-CM

## 2011-08-01 DIAGNOSIS — O24429 Gestational diabetes mellitus in childbirth, unspecified control: Secondary | ICD-10-CM

## 2011-08-01 DIAGNOSIS — O24419 Gestational diabetes mellitus in pregnancy, unspecified control: Secondary | ICD-10-CM

## 2011-08-01 DIAGNOSIS — O99814 Abnormal glucose complicating childbirth: Secondary | ICD-10-CM

## 2011-08-01 NOTE — Assessment & Plan Note (Signed)
Check labs Pt to monitor blood sugars at home

## 2011-08-01 NOTE — Assessment & Plan Note (Signed)
Per gyn 

## 2011-08-01 NOTE — Patient Instructions (Signed)
Calorie Counting Diet A calorie counting diet requires you to eat the number of calories that are right for you in a day. Calories are the measurement of how much energy you get from the food you eat. Eating the right amount of calories is important for staying at a healthy weight. If you eat too many calories, your body will store them as fat and you may gain weight. If you eat too few calories, you may lose weight. Counting the number of calories you eat during a day will help you know if you are eating the right amount. A Registered Dietitian can determine how many calories you need in a day. The amount of calories needed varies from person to person. If your goal is to lose weight, you will need to eat fewer calories. Losing weight can benefit you if you are overweight or have health problems such as heart disease, high blood pressure, or diabetes. If your goal is to gain weight, you will need to eat more calories. Gaining weight may be necessary if you have a certain health problem that causes your body to need more energy. TIPS Whether you are increasing or decreasing the number of calories you eat during a day, it may be hard to get used to changes in what you eat and drink. The following are tips to help you keep track of the number of calories you eat.  Measure foods at home with measuring cups. This helps you know the amount of food and number of calories you are eating.   Restaurants often serve food in amounts that are larger than 1 serving. While eating out, estimate how many servings of a food you are given. For example, a serving of cooked rice is  cup or about the size of half of a fist. Knowing serving sizes will help you be aware of how much food you are eating at restaurants.   Ask for smaller portion sizes or child-size portions at restaurants.   Plan to eat half of a meal at a restaurant. Take the rest home or share the other half with a friend.   Read the Nutrition Facts panel on  food labels for calorie content and serving size. You can find out how many servings are in a package, the size of a serving, and the number of calories each serving has.   For example, a package might contain 3 cookies. The Nutrition Facts panel on that package says that 1 serving is 1 cookie. Below that, it will say there are 3 servings in the container. The calories section of the Nutrition Facts label says there are 90 calories. This means there are 90 calories in 1 cookie (1 serving). If you eat 1 cookie you have eaten 90 calories. If you eat all 3 cookies, you have eaten 270 calories (3 servings x 90 calories = 270 calories).  The list below tells you how big or small some common portion sizes are.  1 oz.........4 stacked dice.   3 oz.........Deck of cards.   1 tsp........Tip of little finger.   1 tbs........Thumb.   2 tbs........Golf ball.    cup.......Half of a fist.   1 cup........A fist.  KEEP A FOOD LOG Write down every food item you eat, the amount you eat, and the number of calories in each food you eat during the day. At the end of the day, you can add up the total number of calories you have eaten. It may help to keep a   list like the one below. Find out the calorie information by reading the Nutrition Facts panel on food labels. Breakfast  Bran cereal (1 cup, 110 calories).   Fat-free milk ( cup, 45 calories).  Snack  Apple (1 medium, 80 calories).  Lunch  Spinach (1 cup, 20 calories).   Tomato ( medium, 20 calories).   Chicken breast strips (3 oz, 165 calories).   Shredded cheddar cheese ( cup, 110 calories).   Light Italian dressing (2 tbs, 60 calories).   Whole-wheat bread (1 slice, 80 calories).   Tub margarine (1 tsp, 35 calories).   Vegetable soup (1 cup, 160 calories).  Dinner  Pork chop (3 oz, 190 calories).   Brown rice (1 cup, 215 calories).   Steamed broccoli ( cup, 20 calories).   Strawberries (1  cup, 65 calories).   Whipped  cream (1 tbs, 50 calories).  Daily Calorie Total: 1425 Document Released: 08/08/2005 Document Revised: 04/20/2011 Document Reviewed: 02/02/2007 Exit[employer] Patient Information 2012 [employer]. 

## 2011-08-01 NOTE — Assessment & Plan Note (Signed)
D/w pt diet --she is joining [employer] but would still like to see a nutritionist Also discussed exercise

## 2011-08-01 NOTE — Progress Notes (Signed)
  Subjective:    Patient ID: Susan Andrade, female    DOB: November 02, 1980, 30 y.o.   MRN: [id number]  HPI Pt here to establish.  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  She has seen cardio in past----they will see her prn.  Pt sees Dr Vincente Poli for gyn.   Review of Systems As above    Objective:   Physical Exam  Constitutional: She is oriented to person, place, and time. She appears well-developed and well-nourished.  Neck: Normal range of motion. Neck supple.  Cardiovascular: Normal rate, regular rhythm and normal heart sounds.   No murmur heard. Pulmonary/Chest: Effort normal and breath sounds normal. No respiratory distress. She has no wheezes. She has no rales. She exhibits no tenderness.  Abdominal: Soft. She exhibits no distension. There is no tenderness.  Musculoskeletal: She exhibits no edema and no tenderness.  Neurological: She is alert and oriented to person, place, and time.  Psychiatric: She has a normal mood and affect. Her behavior is normal. Judgment and thought content normal.          Assessment & Plan:

## 2011-08-02 LAB — BASIC METABOLIC PANEL
Chloride: 103 mEq/L (ref 96–112)
GFR: 126.1 mL/min (ref >60.00)
Potassium: 4.1 mEq/L (ref 3.5–5.1)
Sodium: 138 mEq/L (ref 135–145)

## 2011-08-02 LAB — HEMOGLOBIN A1C: Hgb A1c MFr Bld: 5 % (ref 4.6–6.5)

## 2011-08-08 ENCOUNTER — Telehealth: Payer: Self-pay | Admitting: Family Medicine

## 2011-08-08 MED ORDER — ONETOUCH DELICA LANCETS MISC: Status: DC

## 2011-08-08 MED ORDER — GLUCOSE BLOOD VI STRP: ORAL_STRIP | Status: AC

## 2011-08-08 NOTE — Telephone Encounter (Signed)
Rx faxed.    KP 

## 2011-08-08 NOTE — Telephone Encounter (Signed)
Patient needs rx for test strips & lancetts for one touch verio - [facility]

## 2011-10-20 ENCOUNTER — Ambulatory Visit: Payer: BC Managed Care – PPO | Admitting: Family Medicine

## 2011-10-20 ENCOUNTER — Ambulatory Visit (INDEPENDENT_AMBULATORY_CARE_PROVIDER_SITE_OTHER): Payer: BC Managed Care – PPO | Admitting: Family Medicine

## 2011-10-20 ENCOUNTER — Encounter: Payer: Self-pay | Admitting: Family Medicine

## 2011-10-20 VITALS — BP 130/70 | HR 82 | Temp 98.6°F | Ht 65.5 in | Wt 205.8 lb

## 2011-10-20 DIAGNOSIS — J019 Acute sinusitis, unspecified: Secondary | ICD-10-CM

## 2011-10-20 MED ORDER — BENZONATATE 200 MG PO CAPS: 200.0000 mg | ORAL_CAPSULE | Freq: Three times a day (TID) | ORAL | Status: AC | PRN

## 2011-10-20 MED ORDER — CLARITHROMYCIN ER 500 MG PO TB24: 1000.0000 mg | ORAL_TABLET | Freq: Every day | ORAL | Status: AC

## 2011-10-20 NOTE — Progress Notes (Signed)
  Subjective:    Patient ID: Susan Andrade, female    DOB: 1981/08/04, 31 y.o.   MRN: [id number]  HPI ? Sinusitis- has chronic allergies, sxs started 3 weeks ago.  Restarted Claritin and Flonase.  Thought she was improving and 'now it's back w/ a vengeance'.  + facial pain, nasal congestion, nausea due to PND.  No ear pain, fever.  + productive cough.   Review of Systems For ROS see HPI     Objective:   Physical Exam  Vitals reviewed. Constitutional: She appears well-developed and well-nourished. No distress.  HENT:  Head: Normocephalic and atraumatic.  Right Ear: Tympanic membrane normal.  Left Ear: Tympanic membrane normal.  Nose: Mucosal edema and rhinorrhea present. Right sinus exhibits maxillary sinus tenderness and frontal sinus tenderness. Left sinus exhibits maxillary sinus tenderness and frontal sinus tenderness.  Mouth/Throat: Uvula is midline and mucous membranes are normal. Posterior oropharyngeal erythema present. No oropharyngeal exudate.  Eyes: Conjunctivae and EOM are normal. Pupils are equal, round, and reactive to light.  Neck: Normal range of motion. Neck supple.  Cardiovascular: Normal rate, regular rhythm and normal heart sounds.   Pulmonary/Chest: Effort normal and breath sounds normal. No respiratory distress. She has no wheezes.  Lymphadenopathy:    She has no cervical adenopathy.          Assessment & Plan:

## 2011-10-20 NOTE — Patient Instructions (Signed)
This is most likely a sinus infection Start the Biaxin- 2 tabs at the same time daily- w/ food Drink plenty of fluids Continue your nasal spray and allergy meds REST! Call with any questions or concerns Hang in there!!!

## 2011-10-20 NOTE — Assessment & Plan Note (Signed)
Pt's hx and PE consistent w/ infxn.  Start abx.  Reviewed supportive care and red flags that should prompt return.  Pt expressed understanding and is in agreement w/ plan.  

## 2012-05-17 ENCOUNTER — Telehealth: Payer: Self-pay | Admitting: Genetic Counselor

## 2012-05-17 NOTE — Telephone Encounter (Signed)
C/D 05/17/12 for appt. 06/21/12

## 2012-05-17 NOTE — Telephone Encounter (Signed)
S/W pt in re Genetic Test appt 10/31 @ 2.  Referring [facility] packet mailed.

## 2012-06-15 ENCOUNTER — Other Ambulatory Visit: Payer: Self-pay | Admitting: Family Medicine

## 2012-06-15 DIAGNOSIS — J301 Allergic rhinitis due to pollen: Secondary | ICD-10-CM

## 2012-06-15 MED ORDER — FLUTICASONE PROPIONATE 50 MCG/ACT NA SUSP: 1.0000 | Freq: Every day | NASAL | Status: DC

## 2012-06-15 NOTE — Telephone Encounter (Signed)
[place] pt please advise per has not been seen since 10-20-11

## 2012-06-15 NOTE — Telephone Encounter (Signed)
Ok to refill until February.   

## 2012-06-15 NOTE — Telephone Encounter (Signed)
refill Fluticasone 50 MCG Nasal SP (120INH) Spray 1 spray in each nostril every day  #16, last fill 2.13.13--last ov 2.28.13 Acute

## 2012-06-21 ENCOUNTER — Ambulatory Visit (HOSPITAL_BASED_OUTPATIENT_CLINIC_OR_DEPARTMENT_OTHER): Payer: BC Managed Care – PPO | Admitting: Genetic Counselor

## 2012-06-21 ENCOUNTER — Ambulatory Visit: Payer: BC Managed Care – PPO | Admitting: Lab

## 2012-06-21 ENCOUNTER — Encounter: Payer: Self-pay | Admitting: Genetic Counselor

## 2012-06-21 DIAGNOSIS — Z808 Family history of malignant neoplasm of other organs or systems: Secondary | ICD-10-CM

## 2012-06-21 DIAGNOSIS — IMO0002 Reserved for concepts with insufficient information to code with codable children: Secondary | ICD-10-CM

## 2012-06-21 DIAGNOSIS — Z800 Family history of malignant neoplasm of digestive organs: Secondary | ICD-10-CM

## 2012-06-21 NOTE — Progress Notes (Signed)
Dr.  Antony Haste requested a consultation for genetic counseling and risk assessment for Susan Andrade, a 31 y.o. female, for discussion of her family history of melanoma and pancreatic cancer. She presents to clinic today to discuss the possibility of a genetic predisposition to cancer, and to further clarify her risks, as well as her family members' risks for cancer.   HISTORY OF PRESENT ILLNESS: Susan Andrade is a 31 y.o. female with no personal history of cancer.    Past Medical History  Diagnosis Date  . Allergy   . Sinusitis-bronchiectasis-situs inversus syndrome   . Polycystic ovary disease   . Guillain-Barre disease   . Leukocytoclastic vasculitis   . Hyperglycemia   . DM (diabetes mellitus) in pregnancy   . Retinal hemorrhage   . GBS (Guillain Barre syndrome)   . GBS (Guillain Barre syndrome)   . Vasculitis     No past surgical history on file.  History  Substance Use Topics  . Smoking status: Never Smoker   . Smokeless tobacco: Not on file  . Alcohol Use: No    REPRODUCTIVE HISTORY AND PERSONAL RISK ASSESSMENT FACTORS: Menarche was at age 89.   Premenopausal Uterus Intact: Yes Ovaries Intact: Yes G1P1A0 , first live birth at age 59  She has not previously undergone treatment for infertility, however, has used fertility meds to treat her polycystic ovarian disease OCP use for 12-13 years   She has not used HRT in the past.    FAMILY HISTORY:  We obtained a detailed, 4-generation family history.  Significant diagnoses are listed below: Family History  Problem Relation Age of Onset  . Alcohol abuse Other   . Arthritis Other   . Diabetes Other   . Mental illness Other   . Stroke Other   . Coronary artery disease Other   . Pancreatic cancer Father 55  . Melanoma Father     dx in early 5s  The patient's father was diagnosed with melanoma in his 53s, and with pancreatic cancer at 3.  There is no other report of cancer on either side of the  family.  Patient's maternal ancestors are of [place] descent, and paternal ancestors are of [place] descent. There is no reported Ashkenazi Jewish ancestry. There is no  known consanguinity.  GENETIC COUNSELING RISK ASSESSMENT, DISCUSSION, AND SUGGESTED FOLLOW UP: We reviewed the natural history and genetic etiology of sporadic, familial and hereditary cancer syndromes.  We discussed that approximately 40% of families with melanoma and pancreatic cancer have a mutation in the p16 gene.   We reviewed the red flags of hereditary cancer syndromes and the dominant inheritance patterns. If the patient tests positive she would be elibigle for annual skin exams and we would refer her to the [facility] Cancer surveillance program.  The patient's family history of melanoma and pancreatic cancer is suggestive of the following possible diagnosis: p16 mutations  We discussed that identification of a hereditary cancer syndrome may help her care providers tailor the patients medical management. If a mutation in p16 gene is detected in this case, the [employer] recommendations would include increased cancer surveillance. If a mutation is detected, the patient will be referred back to the referring provider and to any additional appropriate care providers to discuss the relevant options.   If a mutation is not found in the patient, this will decrease the likelihood of a p16 mutation as the explanation for her family history of melanoma and pancreatic cancer. Cancer  surveillance options would be discussed for the patient according to the appropriate standard National Comprehensive Cancer Network and American Cancer Society guidelines, with consideration of their personal and family history risk factors. In this case, the patient will be referred back to their care providers for discussions of management.   After considering the risks, benefits, and limitations, the patient provided  informed consent for  the following  testing: Melaris through [facility].   Per the patient's request, we will contact her by telephone to discuss these results. A follow up genetic counseling visit will be scheduled if indicated.  The patient was seen for a total of 60 minutes, greater than 50% of which was spent face-to-face counseling.  This plan is being carried out per Dr. Shyrl Numbers recommendations.  This note will also be sent to the referring provider via the electronic medical record. The patient will be supplied with a summary of this genetic counseling discussion as well as educational information on the discussed hereditary cancer syndromes following the conclusion of their visit.   Patient was discussed with Dr. Drue Second.   _______________________________________________________________________ For Office Staff:  Number of people involved in session: 2 Was an Intern/ student involved with case: no

## 2012-06-26 ENCOUNTER — Encounter: Payer: Self-pay | Admitting: Genetic Counselor

## 2012-06-29 ENCOUNTER — Telehealth: Payer: Self-pay | Admitting: Genetic Counselor

## 2012-06-29 ENCOUNTER — Encounter: Payer: Self-pay | Admitting: Genetic Counselor

## 2012-06-29 NOTE — Telephone Encounter (Signed)
LVMM with good news about p16 testing.

## 2012-10-05 ENCOUNTER — Encounter (HOSPITAL_COMMUNITY): Payer: Self-pay | Admitting: *Deleted

## 2012-10-05 ENCOUNTER — Inpatient Hospital Stay (HOSPITAL_COMMUNITY): Payer: BC Managed Care – PPO

## 2012-10-05 ENCOUNTER — Inpatient Hospital Stay (HOSPITAL_COMMUNITY)
Admission: AD | Admit: 2012-10-05 | Discharge: 2012-10-05 | Disposition: A | Discharge status: Home / Self Care | Payer: BC Managed Care – PPO | Source: Ambulatory Visit | Attending: Obstetrics and Gynecology | Admitting: Obstetrics and Gynecology

## 2012-10-05 DIAGNOSIS — O99891 Other specified diseases and conditions complicating pregnancy: Secondary | ICD-10-CM | POA: Insufficient documentation

## 2012-10-05 DIAGNOSIS — W19XXXA Unspecified fall, initial encounter: Secondary | ICD-10-CM | POA: Insufficient documentation

## 2012-10-05 DIAGNOSIS — R109 Unspecified abdominal pain: Secondary | ICD-10-CM | POA: Insufficient documentation

## 2012-10-05 MED ORDER — HYDROCODONE-ACETAMINOPHEN 5-325 MG PO TABS: 2.0000 | ORAL_TABLET | Freq: Four times a day (QID) | ORAL | Status: DC | PRN

## 2012-10-05 MED ORDER — OXYCODONE-ACETAMINOPHEN 5-325 MG PO TABS
2.0000 | ORAL_TABLET | Freq: Once | ORAL | Status: AC
  Administered 2012-10-05: 2 via ORAL
  Filled 2012-10-05: qty 2

## 2012-10-05 MED ORDER — CYCLOBENZAPRINE HCL 10 MG PO TABS: 10.0000 mg | ORAL_TABLET | Freq: Three times a day (TID) | ORAL | Status: DC | PRN

## 2012-10-05 NOTE — Progress Notes (Signed)
32 yo @ 10 3/7 weeks with dates confirmed by early U/S in office. Today fell on ice onto buttocks and has pain over coccyx. No bleeding or menstrual type cramping.  Blood pressure 100/63, pulse 90, temperature 98.8 F (37.1 C), temperature source Oral, resp. rate 16, last menstrual period 07/24/2012.  U/S C/W 8 3/7 weeks with no FHT  A: SAB      Bruised or fx coccyx  P: D/W patient and husband above     D/W options of management      Will FU in office on Monday      Will call or return to MAU for vaginal bleeding or cramping      Vicodin 5/325, #20 prn       Flexeril 10mg  tid prn #30 NR

## 2012-10-05 NOTE — MAU Provider Note (Signed)
History     CSN: [id number]  Arrival date and time: 10/05/12 1910   First Provider Initiated Contact with Patient 10/05/12 1951      Chief Complaint  Patient presents with  . Fall   HPI Pt is [redacted]w[redacted]d G2P1001 with confirmation ultrasound done at 6 weeks at Dr. Lynnell Dike office.  Today she presents with sacral pain after falling on her tail bone at 2:30 pm with progressively gotten worse.  She took Tylenol 5 pm. She ate dinner at 6 pm. She is concerned about the baby because she is having low abdominal cramping on her left side. Pt denies spotting or bleeding. Pt has a history of gestational diabetes with her previous pregnancy  Past Medical History  Diagnosis Date  . Allergy   . Sinusitis-bronchiectasis-situs inversus syndrome   . Polycystic ovary disease   . Guillain-Barre disease   . Leukocytoclastic vasculitis   . Hyperglycemia   . DM (diabetes mellitus) in pregnancy   . Retinal hemorrhage   . GBS (Guillain Barre syndrome)   . GBS (Guillain Barre syndrome)   . Vasculitis     No past surgical history on file.  Family History  Problem Relation Age of Onset  . Alcohol abuse Other   . Arthritis Other   . Diabetes Other   . Mental illness Other   . Stroke Other   . Coronary artery disease Other   . Pancreatic cancer Father 77  . Melanoma Father     dx in early 34s    History  Substance Use Topics  . Smoking status: Never Smoker   . Smokeless tobacco: Not on file  . Alcohol Use: No    Allergies:  Allergies  Allergen Reactions  . Sudafed (Pseudoephedrine)   . Sulfonamide Derivatives Other (See Comments)    Family history of rxn; pt does not want to take  . Amoxicillin Rash  . Penicillins Rash    Prescriptions prior to admission  Medication Sig Dispense Refill  . acetaminophen (TYLENOL) 500 MG tablet Take 1,000 mg by mouth every 6 (six) hours as needed (pain).      . Prenatal Vit-Fe Fumarate-FA (PRENATAL MULTIVITAMIN) TABS Take 1 tablet by mouth at bedtime.       . Progesterone 100 MG SUPP Place 1 suppository vaginally 2 (two) times daily.      Letta Pate DELICA LANCETS MISC Test blood sugars twice a day as needed  100 each  5    ROS Physical Exam   Blood pressure 138/86, temperature 98.8 F (37.1 C), temperature source Oral, resp. rate 16, last menstrual period 07/24/2012.  Physical Exam  MAU Course  Procedures Care handed over to Wynelle Bourgeois, CNM    Assessment and Plan    LINEBERRY,SUSAN 10/05/2012, 7:53 PM   US Ob Comp Less 14 Wks  10/05/2012  *RADIOLOGY REPORT*  Clinical Data: Status post fall.  Cramping and abdominal pain.  OBSTETRIC <14 WK ULTRASOUND, TRANSVAGINAL OB US  Technique:  Transabdominal and transvaginal ultrasound was performed for evaluation of the gestation as well as the maternal uterus and adnexal regions.  Number of gestation: 1 Heart Rate: No fetal cardiac activity detectable.  CRL: 1.9 cm         8w  3d                 [place] EDC: 05/14/2013  Maternal uterus/adnexae: No subchorionic hemorrhage noted.  The adnexal structures appear unremarkable.  Normal appearance of the ovaries.  No free fluid.  IMPRESSION:  1.  Findings consistent with intrauterine fetal demise.  Critical Value/emergent results were called by telephone at the time of interpretation on 10/05/2012 at 8:57 p.m. to Mindi Junker, who verbally acknowledged these results.   Original Report Authenticated By: Signa Kell, M.D.    Dr Henderson Cloud is now on unit. Reviewed case with him.  He will tell patient and make plan for D&E.  Wynelle Bourgeois CNM

## 2012-10-05 NOTE — MAU Note (Signed)
Pt states she fell while outside with her son and fell on her tail and is now having some abdominal pain

## 2012-10-10 ENCOUNTER — Ambulatory Visit (HOSPITAL_COMMUNITY): Payer: BC Managed Care – PPO | Admitting: Anesthesiology

## 2012-10-10 ENCOUNTER — Ambulatory Visit (HOSPITAL_COMMUNITY): Payer: BC Managed Care – PPO

## 2012-10-10 ENCOUNTER — Ambulatory Visit (HOSPITAL_COMMUNITY)
Admission: RE | Admit: 2012-10-10 | Discharge: 2012-10-10 | Disposition: A | Discharge status: Home / Self Care | Payer: BC Managed Care – PPO | Source: Ambulatory Visit | Attending: Obstetrics and Gynecology | Admitting: Obstetrics and Gynecology

## 2012-10-10 ENCOUNTER — Encounter (HOSPITAL_COMMUNITY)
Admission: RE | Disposition: A | Discharge status: Home / Self Care | Payer: Self-pay | Source: Ambulatory Visit | Attending: Obstetrics and Gynecology

## 2012-10-10 ENCOUNTER — Encounter (HOSPITAL_COMMUNITY): Payer: Self-pay | Admitting: Anesthesiology

## 2012-10-10 ENCOUNTER — Encounter (HOSPITAL_COMMUNITY): Payer: Self-pay | Admitting: *Deleted

## 2012-10-10 DIAGNOSIS — J019 Acute sinusitis, unspecified: Secondary | ICD-10-CM

## 2012-10-10 DIAGNOSIS — D1739 Benign lipomatous neoplasm of skin and subcutaneous tissue of other sites: Secondary | ICD-10-CM

## 2012-10-10 DIAGNOSIS — L723 Sebaceous cyst: Secondary | ICD-10-CM

## 2012-10-10 DIAGNOSIS — J309 Allergic rhinitis, unspecified: Secondary | ICD-10-CM

## 2012-10-10 DIAGNOSIS — R002 Palpitations: Secondary | ICD-10-CM

## 2012-10-10 DIAGNOSIS — Q893 Situs inversus: Secondary | ICD-10-CM

## 2012-10-10 DIAGNOSIS — E669 Obesity, unspecified: Secondary | ICD-10-CM

## 2012-10-10 DIAGNOSIS — L708 Other acne: Secondary | ICD-10-CM

## 2012-10-10 DIAGNOSIS — Z8742 Personal history of other diseases of the female genital tract: Secondary | ICD-10-CM

## 2012-10-10 DIAGNOSIS — Q249 Congenital malformation of heart, unspecified: Secondary | ICD-10-CM

## 2012-10-10 DIAGNOSIS — J069 Acute upper respiratory infection, unspecified: Secondary | ICD-10-CM

## 2012-10-10 DIAGNOSIS — R259 Unspecified abnormal involuntary movements: Secondary | ICD-10-CM

## 2012-10-10 DIAGNOSIS — J301 Allergic rhinitis due to pollen: Secondary | ICD-10-CM

## 2012-10-10 DIAGNOSIS — O021 Missed abortion: Secondary | ICD-10-CM | POA: Insufficient documentation

## 2012-10-10 DIAGNOSIS — O24429 Gestational diabetes mellitus in childbirth, unspecified control: Secondary | ICD-10-CM

## 2012-10-10 DIAGNOSIS — R071 Chest pain on breathing: Secondary | ICD-10-CM

## 2012-10-10 HISTORY — PX: DILATION AND EVACUATION: SHX1459

## 2012-10-10 LAB — CBC
HCT: 38.8 % (ref 36.0–46.0)
Hemoglobin: 13 g/dL (ref 12.0–15.0)
MCH: 26.9 pg (ref 26.0–34.0)
RBC: 4.83 MIL/uL (ref 3.87–5.11)

## 2012-10-10 SURGERY — DILATION AND EVACUATION, UTERUS
Anesthesia: Monitor Anesthesia Care | Site: Vagina | Wound class: Clean Contaminated

## 2012-10-10 MED ORDER — LACTATED RINGERS IV SOLN
INTRAVENOUS | Status: DC
  Administered 2012-10-10: 13:00:00 via INTRAVENOUS

## 2012-10-10 MED ORDER — FENTANYL CITRATE 0.05 MG/ML IJ SOLN
INTRAMUSCULAR | Status: DC | PRN
  Administered 2012-10-10 (×2): 50 ug via INTRAVENOUS

## 2012-10-10 MED ORDER — MIDAZOLAM HCL 5 MG/5ML IJ SOLN
INTRAMUSCULAR | Status: DC | PRN
  Administered 2012-10-10: 2 mg via INTRAVENOUS

## 2012-10-10 MED ORDER — METHYLERGONOVINE MALEATE 0.2 MG PO TABS: 0.2000 mg | ORAL_TABLET | Freq: Four times a day (QID) | ORAL | Status: DC

## 2012-10-10 MED ORDER — DEXAMETHASONE SODIUM PHOSPHATE 4 MG/ML IJ SOLN
INTRAMUSCULAR | Status: DC | PRN
  Administered 2012-10-10: 8 mg via INTRAVENOUS

## 2012-10-10 MED ORDER — METHYLERGONOVINE MALEATE 0.2 MG/ML IJ SOLN
INTRAMUSCULAR | Status: DC | PRN
  Administered 2012-10-10: 0.2 mg via INTRAMUSCULAR

## 2012-10-10 MED ORDER — LIDOCAINE HCL (CARDIAC) 20 MG/ML IV SOLN
INTRAVENOUS | Status: AC
Start: 2012-10-10 — End: 2012-10-10
  Filled 2012-10-10: qty 5

## 2012-10-10 MED ORDER — KETOROLAC TROMETHAMINE 30 MG/ML IJ SOLN: 15.0000 mg | Freq: Once | INTRAMUSCULAR | Status: DC | PRN

## 2012-10-10 MED ORDER — ONDANSETRON HCL 4 MG/2ML IJ SOLN
INTRAMUSCULAR | Status: AC
  Filled 2012-10-10: qty 2

## 2012-10-10 MED ORDER — FENTANYL CITRATE 0.05 MG/ML IJ SOLN
INTRAMUSCULAR | Status: AC
  Administered 2012-10-10: 25 ug via INTRAVENOUS
  Filled 2012-10-10: qty 2

## 2012-10-10 MED ORDER — FENTANYL CITRATE 0.05 MG/ML IJ SOLN
25.0000 ug | INTRAMUSCULAR | Status: DC | PRN
  Administered 2012-10-10: 25 ug via INTRAVENOUS

## 2012-10-10 MED ORDER — DEXAMETHASONE SODIUM PHOSPHATE 10 MG/ML IJ SOLN
INTRAMUSCULAR | Status: AC
  Filled 2012-10-10: qty 1

## 2012-10-10 MED ORDER — ONDANSETRON HCL 4 MG/2ML IJ SOLN
INTRAMUSCULAR | Status: DC | PRN
  Administered 2012-10-10: 4 mg via INTRAVENOUS

## 2012-10-10 MED ORDER — PROPOFOL 10 MG/ML IV EMUL
INTRAVENOUS | Status: DC | PRN
  Administered 2012-10-10: 250 mg via INTRAVENOUS
  Administered 2012-10-10: 50 mg via INTRAVENOUS

## 2012-10-10 MED ORDER — KETOROLAC TROMETHAMINE 30 MG/ML IJ SOLN
INTRAMUSCULAR | Status: DC | PRN
  Administered 2012-10-10: 30 mg via INTRAVENOUS

## 2012-10-10 MED ORDER — LACTATED RINGERS IV SOLN
INTRAVENOUS | Status: DC
  Administered 2012-10-10 (×2): via INTRAVENOUS

## 2012-10-10 MED ORDER — METOCLOPRAMIDE HCL 5 MG/ML IJ SOLN: 10.0000 mg | Freq: Once | INTRAMUSCULAR | Status: DC | PRN

## 2012-10-10 MED ORDER — MIDAZOLAM HCL 2 MG/2ML IJ SOLN
INTRAMUSCULAR | Status: AC
  Filled 2012-10-10: qty 2

## 2012-10-10 MED ORDER — LIDOCAINE HCL (CARDIAC) 20 MG/ML IV SOLN
INTRAVENOUS | Status: DC | PRN
  Administered 2012-10-10: 50 mg via INTRAVENOUS

## 2012-10-10 MED ORDER — KETOROLAC TROMETHAMINE 30 MG/ML IJ SOLN
INTRAMUSCULAR | Status: AC
  Filled 2012-10-10: qty 1

## 2012-10-10 MED ORDER — LIDOCAINE HCL 1 % IJ SOLN
INTRAMUSCULAR | Status: DC | PRN
  Administered 2012-10-10: 10 mL

## 2012-10-10 MED ORDER — GENTAMICIN SULFATE 40 MG/ML IJ SOLN
INTRAVENOUS | Status: AC
  Administered 2012-10-10: 100 mL via INTRAVENOUS
  Filled 2012-10-10: qty 8.25

## 2012-10-10 MED ORDER — FENTANYL CITRATE 0.05 MG/ML IJ SOLN
INTRAMUSCULAR | Status: AC
  Filled 2012-10-10: qty 2

## 2012-10-10 MED ORDER — PROPOFOL 10 MG/ML IV EMUL
INTRAVENOUS | Status: AC
  Filled 2012-10-10: qty 40

## 2012-10-10 SURGICAL SUPPLY — 22 items
CATH ROBINSON RED A/P 16FR (CATHETERS) ×2 IMPLANT
CLOTH BEACON ORANGE TIMEOUT ST (SAFETY) ×2 IMPLANT
DECANTER SPIKE VIAL GLASS SM (MISCELLANEOUS) ×2 IMPLANT
DRAPE HYSTEROSCOPY (DRAPE) ×1 IMPLANT
GLOVE BIO SURGEON STRL SZ 6.5 (GLOVE) ×2 IMPLANT
GLOVE SURG SS PI 6.5 STRL IVOR (GLOVE) ×2 IMPLANT
GOWN STRL REIN XL XLG (GOWN DISPOSABLE) ×4 IMPLANT
KIT BERKELEY 1ST TRIMESTER 3/8 (MISCELLANEOUS) ×2 IMPLANT
NDL SPNL 22GX3.5 QUINCKE BK (NEEDLE) ×1 IMPLANT
NEEDLE SPNL 22GX3.5 QUINCKE BK (NEEDLE) ×2 IMPLANT
NS IRRIG 1000ML POUR BTL (IV SOLUTION) ×2 IMPLANT
PACK VAGINAL MINOR WOMEN LF (CUSTOM PROCEDURE TRAY) ×2 IMPLANT
PAD OB MATERNITY 4.3X12.25 (PERSONAL CARE ITEMS) ×2 IMPLANT
PAD PREP 24X48 CUFFED NSTRL (MISCELLANEOUS) ×2 IMPLANT
SET BERKELEY SUCTION TUBING (SUCTIONS) ×2 IMPLANT
SYR CONTROL 10ML LL (SYRINGE) ×2 IMPLANT
TOWEL OR 17X24 6PK STRL BLUE (TOWEL DISPOSABLE) ×4 IMPLANT
VACURETTE 10 RIGID CVD (CANNULA) IMPLANT
VACURETTE 7MM CVD STRL WRAP (CANNULA) ×1 IMPLANT
VACURETTE 8 RIGID CVD (CANNULA) IMPLANT
VACURETTE 9 RIGID CVD (CANNULA) IMPLANT
WATER STERILE IRR 1000ML POUR (IV SOLUTION) ×2 IMPLANT

## 2012-10-10 NOTE — Anesthesia Postprocedure Evaluation (Signed)
  Anesthesia Post-op Note  Anesthesia Post Note  Patient: Susan Andrade  Procedure(s) Performed: Procedure(s) (LRB): DILATATION AND EVACUATION (N/A)  Anesthesia type: General  Patient location: PACU  Post pain: Pain level controlled  Post assessment: Post-op Vital signs reviewed  Last Vitals:  Filed Vitals:   10/10/12 1600  BP: 122/70  Pulse: 95  Temp: 36.7 C  Resp: 12    Post vital signs: Reviewed  Level of consciousness: sedated  Complications: No apparent anesthesia complications

## 2012-10-10 NOTE — Brief Op Note (Signed)
10/10/2012  2:57 PM  PATIENT:  [place]  32 y.o. female  PRE-OPERATIVE DIAGNOSIS:  missed ab cpt [phone]  POST-OPERATIVE DIAGNOSIS:  missed ab  PROCEDURE:  Procedure(s): DILATATION AND EVACUATION (N/A) With ultrasound guidance  SURGEON:  Surgeon(s) and Role:    * Jeani Hawking, MD - Primary  PHYSICIAN ASSISTANT:   ASSISTANTS: none   ANESTHESIA:   paracervical block and MAC  EBL:  Total I/O In: -  Out: 100 [Blood:100]  BLOOD ADMINISTERED:none  DRAINS: none   LOCAL MEDICATIONS USED:  NONE  SPECIMEN:  Source of Specimen:  POC  DISPOSITION OF SPECIMEN:  PATHOLOGY  COUNTS:  YES  TOURNIQUET:  * No tourniquets in log *  DICTATION: .Other Dictation: Dictation Number [id number]  PLAN OF CARE: Discharge to home after PACU  PATIENT DISPOSITION:  To home   Delay start of Pharmacological VTE agent (>24hrs) due to surgical blood loss or risk of bleeding: not applicable

## 2012-10-10 NOTE — Progress Notes (Signed)
History and Physical on the chart. No significant changes Will proceed with D and E with ultrasound guidance. Consent signed. Chromosome studies per patient request.

## 2012-10-10 NOTE — Transfer of Care (Signed)
Immediate Anesthesia Transfer of Care Note  Patient: Susan Andrade  Procedure(s) Performed: Procedure(s): DILATATION AND EVACUATION (N/A)  Patient Location: PACU  Anesthesia Type:General  Level of Consciousness: awake, alert  and oriented  Airway & Oxygen Therapy: Patient Spontanous Breathing and Patient connected to nasal cannula oxygen  Post-op Assessment: Report given to PACU RN and Post -op Vital signs reviewed and stable  Post vital signs: Reviewed and stable  Complications: No apparent anesthesia complications

## 2012-10-10 NOTE — Anesthesia Preprocedure Evaluation (Signed)
Anesthesia Evaluation  Patient identified by MRN, date of birth, ID band Patient awake    Reviewed: Allergy & Precautions, H&P , NPO status , Patient's Chart, lab work & pertinent test results, reviewed documented beta blocker date and time   History of Anesthesia Complications Negative for: history of anesthetic complications  Airway Mallampati: II TM Distance: >3 FB Neck ROM: full    Dental  (+) Teeth Intact   Pulmonary neg pulmonary ROS,  breath sounds clear to auscultation        Cardiovascular Rhythm:regular Rate:Normal  Situs inversus   Neuro/Psych  Neuromuscular disease (h/o guillain-barre) negative psych ROS   GI/Hepatic negative GI ROS, Neg liver ROS,   Endo/Other  PCOS  Renal/GU negative Renal ROS     Musculoskeletal   Abdominal   Peds  Hematology negative hematology ROS (+)   Anesthesia Other Findings   Reproductive/Obstetrics (+) Pregnancy (missed ab (8 week size - 11 week by dates))                           Anesthesia Physical Anesthesia Plan  ASA: II  Anesthesia Plan: MAC   Post-op Pain Management:    Induction:   Airway Management Planned:   Additional Equipment:   Intra-op Plan:   Post-operative Plan:   Informed Consent: I have reviewed the patients History and Physical, chart, labs and discussed the procedure including the risks, benefits and alternatives for the proposed anesthesia with the patient or authorized representative who has indicated his/her understanding and acceptance.     Plan Discussed with: Surgeon and CRNA  Anesthesia Plan Comments:         Anesthesia Quick Evaluation

## 2012-10-10 NOTE — H&P (Signed)
32 year old G 2 P 0 presents for D and E for Missed Ab.  History of C Section x1  History of Situs Inversus  Afebrile VSS General alert and oriented Lung CTAB Car RRR Abdomen is soft and non tender Pelvic deferred to OR  IMPRESSION: Missed Ab  PLAN: D and E with ultrasound guidance Risks of procedure reviewed with patient Consent signed

## 2012-10-11 ENCOUNTER — Encounter (HOSPITAL_COMMUNITY): Payer: Self-pay | Admitting: *Deleted

## 2012-10-11 ENCOUNTER — Inpatient Hospital Stay (HOSPITAL_COMMUNITY)
Admission: AD | Admit: 2012-10-11 | Discharge: 2012-10-11 | Disposition: A | Discharge status: Home / Self Care | Payer: BC Managed Care – PPO | Source: Ambulatory Visit | Attending: Obstetrics and Gynecology | Admitting: Obstetrics and Gynecology

## 2012-10-11 DIAGNOSIS — G8918 Other acute postprocedural pain: Secondary | ICD-10-CM

## 2012-10-11 DIAGNOSIS — T888XXA Other specified complications of surgical and medical care, not elsewhere classified, initial encounter: Secondary | ICD-10-CM

## 2012-10-11 DIAGNOSIS — IMO0002 Reserved for concepts with insufficient information to code with codable children: Secondary | ICD-10-CM | POA: Insufficient documentation

## 2012-10-11 LAB — CBC
HCT: 37.5 % (ref 36.0–46.0)
Hemoglobin: 12.9 g/dL (ref 12.0–15.0)
MCH: 27.6 pg (ref 26.0–34.0)
MCHC: 34.4 g/dL (ref 30.0–36.0)

## 2012-10-11 LAB — ABO/RH: ABO/RH(D): O POS

## 2012-10-11 MED ORDER — HYDROCODONE-ACETAMINOPHEN 5-325 MG PO TABS
2.0000 | ORAL_TABLET | Freq: Once | ORAL | Status: AC
  Administered 2012-10-11: 2 via ORAL
  Filled 2012-10-11: qty 2

## 2012-10-11 MED ORDER — KETOROLAC TROMETHAMINE 10 MG PO TABS: 10.0000 mg | ORAL_TABLET | Freq: Four times a day (QID) | ORAL | Status: DC | PRN

## 2012-10-11 MED ORDER — HYDROCODONE-ACETAMINOPHEN 5-325 MG PO TABS: 2.0000 | ORAL_TABLET | Freq: Four times a day (QID) | ORAL | Status: DC | PRN

## 2012-10-11 MED ORDER — KETOROLAC TROMETHAMINE 10 MG PO TABS
10.0000 mg | ORAL_TABLET | Freq: Once | ORAL | Status: AC
  Administered 2012-10-11: 10 mg via ORAL
  Filled 2012-10-11: qty 1

## 2012-10-11 NOTE — MAU Provider Note (Signed)
Chief Complaint: Vaginal Bleeding   First Provider Initiated Contact with Patient 10/11/12 0419     SUBJECTIVE HPI: Susan Andrade is a 32 y.o. G2P1001 at <24 hour Post-op D&E for missed AB who presents with heavy vaginal bleeding and moderate to severe cramping, 8/10 on pain scale. She was Rx'd Methergine due to an episode of heavy bleeding during the procedure and took a dose at 2330 last night. She was Rx'd Motrin and told that she could use left-over Vicodin for cramping, but received minimal relief w/ 1 Vicodin and no relief w/ Motrin. She reports waking up at 0200 and feeling blood run out of her vagina. When she got up to the bathroom she had soaked through a large pad and onto the bed and her clothing. She passed a large amount of blood in the toilet as well. She reports lighter bleeding since coming to MAU but can still feel it running out. Of note, she fell on the ice 6 days ago and injured her tail bone and is still very uncomfortable as a result.  Past Medical History  Diagnosis Date  . Allergy   . Sinusitis-bronchiectasis-situs inversus syndrome   . Polycystic ovary disease   . Guillain-Barre disease   . Leukocytoclastic vasculitis   . Hyperglycemia   . DM (diabetes mellitus) in pregnancy   . Retinal hemorrhage   . GBS (Guillain Barre syndrome)   . GBS (Guillain Barre syndrome)   . Vasculitis    OB History   Grav Para Term Preterm Abortions TAB SAB Ect Mult Living   2 1 1  0 0 0 0 0 0 1     # Outc Date GA Lbr Len/2nd Wgt Sex Del Anes PTL Lv   1 TRM 12/10    M LTCS EPI  Yes   2 GRA            Comments: System Generated. Please review and update pregnancy details.     Past Surgical History  Procedure Laterality Date  . Cesarean section      2010   History   Social History  . Marital Status: Married    Spouse Name: N/A    Number of Children: N/A  . Years of Education: N/A   Occupational History  . [employer]    Social History Main Topics  . Smoking status: Never  Smoker   . Smokeless tobacco: Not on file  . Alcohol Use: No  . Drug Use: No  . Sexually Active: Yes -- Female partner(s)   Other Topics Concern  . Not on file   Social History Narrative  . No narrative on file   No current facility-administered medications on file prior to encounter.   Current Outpatient Prescriptions on File Prior to Encounter  Medication Sig Dispense Refill  . methylergonovine (METHERGINE) 0.2 MG tablet Take 1 tablet (0.2 mg total) by mouth every 6 (six) hours.  9 tablet  0  . cyclobenzaprine (FLEXERIL) 10 MG tablet Take 1 tablet (10 mg total) by mouth 3 (three) times daily as needed for muscle spasms.  30 tablet  0  . ONETOUCH DELICA LANCETS MISC Test blood sugars twice a day as needed  100 each  5  . Prenatal Vit-Fe Fumarate-FA (PRENATAL MULTIVITAMIN) TABS Take 1 tablet by mouth at bedtime.       Allergies  Allergen Reactions  . Sulfonamide Derivatives Other (See Comments)    Family history of rxn; pt does not want to take  . Amoxicillin Rash  .  Penicillins Rash  . Sudafed (Pseudoephedrine) Palpitations    ROS: Pertinent items in HPI  OBJECTIVE Blood pressure 114/72, pulse 108, temperature 97.2 F (36.2 C), temperature source Oral, resp. rate 18, last menstrual period 07/24/2012, SpO2 99.00%, not currently breastfeeding. GENERAL: Well-developed, well-nourished female in mild distress. Pt unable to tolerate exam until medicated. Unable to tolerate orthostatic VS due to tail bone pain and difficulty changing positions.  HEENT: Normocephalic HEART: Mild tachycardia RESP: normal effort ABDOMEN: Soft, non-tender EXTREMITIES: Nontender, no edema NEURO: Alert and oriented SPECULUM EXAM: NEFG, small amount of dark red blood in vault, scant active bleeding from os. No bleeding from tenaculum sites. Cervix clean. BIMANUAL: cervix closed; uterus difficult to assess due to body habitus, NT, no adnexal tenderness or masses  LAB RESULTS Results for orders placed  during the hospital encounter of 10/11/12 (from the past 24 hour(s))  CBC     Status: Abnormal   Collection Time    10/11/12  2:25 AM      Result Value Range   WBC 12.6 (*) 4.0 - 10.5 K/uL   RBC 4.68  3.87 - 5.11 MIL/uL   Hemoglobin 12.9  12.0 - 15.0 g/dL   HCT 19.1  47.8 - 29.5 %   MCV 80.1  78.0 - 100.0 fL   MCH 27.6  26.0 - 34.0 pg   MCHC 34.4  30.0 - 36.0 g/dL   RDW 62.1  30.8 - 65.7 %   Platelets 305  150 - 400 K/uL  TYPE AND SCREEN     Status: None   Collection Time    10/11/12  2:25 AM      Result Value Range   ABO/RH(D) O POS     Antibody Screen NEG     Sample Expiration 10/14/2012    ABO/RH     Status: None   Collection Time    10/11/12  2:25 AM      Result Value Range   ABO/RH(D) O POS      IMAGING  MAU COURSE 0330: Small amount of pad upon arrival to room. Pt unable to tolerate exam prior to pain meds. 2 Vicodin ordered. 0435: Slight improvement in pain. More comfortable appearing. Pelvic exam done. Will give Toradol for cramping, assess bleeding w/ ambulation and call Dr Vincente Poli w/ results. Bleeding stable.  Pain improved significantly.   ASSESSMENT 1. Post-op bleeding, initial encounter   2. Post-operative pain     PLAN Discharge home. Bleeding precautions.  Discussed normal expectations for methergine use.      Follow-up Information   Follow up with GREWAL,MICHELLE L, MD. (as scheduled)    Contact information:   [address] [place] [address] [phone]       Follow up with [facility] [place] MATERN[facility] ADMISSIONS. (As needed if symptoms worsen-heavy bleeding, fever >100.4, abd pain not controlled w/ pain meds)    Contact information:   [address] [id number] [place] [address] [phone]       Medication List    STOP taking these medications       acetaminophen 500 MG tablet  Commonly known as:  TYLENOL      TAKE these medications       cyclobenzaprine 10 MG tablet  Commonly  known as:  FLEXERIL  Take 1 tablet (10 mg total) by mouth 3 (three) times daily as needed for muscle spasms.     HYDROcodone-acetaminophen 5-325 MG per tablet  Commonly known as:  NORCO/VICODIN  Take 2 tablets by mouth every 6 (six) hours as needed for pain.     ketorolac 10 MG tablet  Commonly known as:  TORADOL  Take 1 tablet (10 mg total) by mouth every 6 (six) hours as needed for pain.     methylergonovine 0.2 MG tablet  Commonly known as:  METHERGINE  Take 1 tablet (0.2 mg total) by mouth every 6 (six) hours.     ONETOUCH DELICA LANCETS Misc  Test blood sugars twice a day as needed     prenatal multivitamin Tabs  Take 1 tablet by mouth at bedtime.       [place], [place] 10/11/2012  5:45 AM

## 2012-10-11 NOTE — MAU Note (Signed)
Pt up to bathroom.

## 2012-10-11 NOTE — MAU Note (Signed)
Pt states she woke during the night to heavy bleeding saturated pad and front of her bed also saturated. Pt rates her discomfort at 6 and states pain comes in waves and medication previously prescribed was not strong enough

## 2012-10-11 NOTE — MAU Note (Signed)
S/P D&E with heavy bleeding tonight

## 2012-10-11 NOTE — Op Note (Signed)
NAMETRENESE, HAFT                ACCOUNT NO.:  [id number]  MEDICAL RECORD NO.:  [id number]  LOCATION:  [facility]                          FACILITY:  [facility]  PHYSICIAN:  Alaiah Lundy L. Jada Fass, M.D.DATE OF BIRTH:  1980-08-30  DATE OF PROCEDURE:  10/10/2012 DATE OF DISCHARGE:  10/10/2012                              OPERATIVE REPORT   PREOPERATIVE DIAGNOSIS:  Missed AB, history of situs inversus.  POSTOPERATIVE DIAGNOSIS:  Missed AB, history of situs inversus.  PROCEDURE:  DNA with ultrasound guidance and chromosome analysis.  SURGEON:  Shaylyn Bawa L. Vincente Poli, M.D.  ANESTHESIA:  MAC with paracervical block.  ESTIMATED BLOOD LOSS:  Less than 100 mL.  COMPLICATIONS:  None.  DRAINS:  None.  PATHOLOGY:  A portion of tissue will be sent for chromosome analysis.  PROCEDURE IN DETAIL:  The patient was taken to the operating room.  She was prepped and draped and draped and ultrasound initially confirmed the IUFD per patient's request.  The speculum was placed in the vagina.  The cervix was grasped with a tenaculum and paracervical block was performed.  The cervical internal os was gently dilated using Pratt dilators.  While watching the ultrasound screening #7 suction cannula was inserted into the uterine cavity, and the uterus was thoroughly suctioned of all tissue.  This was done about 4 times.  After, we retrieved all the tissue, I then inserted a sharp curette and a sharp curettage was performed.  A final suction curettage was performed and the cavity was noted to be clean.  She did have some brisk bleeding and so I did want to go ahead and give her a shot of Methergine in the operating room to help with uterine contraction and I will send her home with some p.o. Methergine for the next 3 days.  At the end of the procedure all sponge, lap, and instrument counts were correct x2.  After all instruments removed, the patient tolerated the procedure well and went to recovery room in stable  condition.    Troyce Gieske L. Vincente Poli, M.D.    Florestine Avers  D:  10/10/2012  T:  10/11/2012  Job:  [id number]

## 2012-10-13 ENCOUNTER — Inpatient Hospital Stay (HOSPITAL_COMMUNITY): Payer: BC Managed Care – PPO

## 2012-10-13 ENCOUNTER — Ambulatory Visit (HOSPITAL_COMMUNITY)
Admission: AD | Admit: 2012-10-13 | Discharge: 2012-10-14 | Disposition: A | Discharge status: Home / Self Care | Payer: BC Managed Care – PPO | Source: Ambulatory Visit | Attending: Obstetrics and Gynecology | Admitting: Obstetrics and Gynecology

## 2012-10-13 ENCOUNTER — Encounter (HOSPITAL_COMMUNITY)
Admission: AD | Disposition: A | Discharge status: Home / Self Care | Payer: Self-pay | Source: Ambulatory Visit | Attending: Obstetrics and Gynecology

## 2012-10-13 ENCOUNTER — Inpatient Hospital Stay (HOSPITAL_COMMUNITY): Payer: BC Managed Care – PPO | Admitting: Anesthesiology

## 2012-10-13 ENCOUNTER — Encounter (HOSPITAL_COMMUNITY): Payer: Self-pay | Admitting: *Deleted

## 2012-10-13 ENCOUNTER — Encounter (HOSPITAL_COMMUNITY): Payer: Self-pay | Admitting: Anesthesiology

## 2012-10-13 DIAGNOSIS — IMO0002 Reserved for concepts with insufficient information to code with codable children: Secondary | ICD-10-CM | POA: Insufficient documentation

## 2012-10-13 DIAGNOSIS — O021 Missed abortion: Secondary | ICD-10-CM | POA: Insufficient documentation

## 2012-10-13 HISTORY — PX: DILATION AND EVACUATION: SHX1459

## 2012-10-13 LAB — CBC
HCT: 34.5 % — ABNORMAL LOW (ref 36.0–46.0)
Hemoglobin: 11.8 g/dL — ABNORMAL LOW (ref 12.0–15.0)
MCV: 81 fL (ref 78.0–100.0)
WBC: 11.5 10*3/uL — ABNORMAL HIGH (ref 4.0–10.5)

## 2012-10-13 SURGERY — DILATION AND EVACUATION, UTERUS
Anesthesia: General | Site: Uterus | Wound class: Clean Contaminated

## 2012-10-13 MED ORDER — DEXTROSE 5 % IV SOLN
INTRAVENOUS | Status: AC
  Administered 2012-10-13: 100 mL via INTRAVENOUS
  Filled 2012-10-13: qty 8.93

## 2012-10-13 MED ORDER — KETOROLAC TROMETHAMINE 30 MG/ML IJ SOLN: 15.0000 mg | Freq: Once | INTRAMUSCULAR | Status: DC | PRN

## 2012-10-13 MED ORDER — OXYCODONE-ACETAMINOPHEN 5-325 MG PO TABS: 1.0000 | ORAL_TABLET | ORAL | Status: DC | PRN

## 2012-10-13 MED ORDER — HYDROMORPHONE HCL PF 1 MG/ML IJ SOLN: 0.2500 mg | INTRAMUSCULAR | Status: DC | PRN

## 2012-10-13 MED ORDER — GLYCOPYRROLATE 0.2 MG/ML IJ SOLN
INTRAMUSCULAR | Status: DC | PRN
  Administered 2012-10-13: 0.1 mg via INTRAVENOUS

## 2012-10-13 MED ORDER — LIDOCAINE HCL (CARDIAC) 20 MG/ML IV SOLN
INTRAVENOUS | Status: DC | PRN
  Administered 2012-10-13: 80 mg via INTRAVENOUS

## 2012-10-13 MED ORDER — FENTANYL CITRATE 0.05 MG/ML IJ SOLN
INTRAMUSCULAR | Status: DC | PRN
  Administered 2012-10-13 (×3): 50 ug via INTRAVENOUS
  Administered 2012-10-13: 100 ug via INTRAVENOUS

## 2012-10-13 MED ORDER — ONDANSETRON HCL 4 MG PO TABS: 4.0000 mg | ORAL_TABLET | Freq: Four times a day (QID) | ORAL | Status: DC | PRN

## 2012-10-13 MED ORDER — PROPOFOL 10 MG/ML IV EMUL
INTRAVENOUS | Status: DC | PRN
  Administered 2012-10-13: 200 mg via INTRAVENOUS

## 2012-10-13 MED ORDER — LACTATED RINGERS IV SOLN
INTRAVENOUS | Status: DC
  Administered 2012-10-13 (×2): via INTRAVENOUS

## 2012-10-13 MED ORDER — METHYLERGONOVINE MALEATE 0.2 MG PO TABS
0.2000 mg | ORAL_TABLET | Freq: Three times a day (TID) | ORAL | Status: DC
  Administered 2012-10-13 – 2012-10-14 (×2): 0.2 mg via ORAL
  Filled 2012-10-13 (×2): qty 1

## 2012-10-13 MED ORDER — MIDAZOLAM HCL 5 MG/5ML IJ SOLN
INTRAMUSCULAR | Status: DC | PRN
  Administered 2012-10-13: 2 mg via INTRAVENOUS

## 2012-10-13 MED ORDER — KETOROLAC TROMETHAMINE 30 MG/ML IJ SOLN
30.0000 mg | Freq: Once | INTRAMUSCULAR | Status: AC
  Administered 2012-10-13: 30 mg via INTRAVENOUS
  Filled 2012-10-13: qty 1

## 2012-10-13 MED ORDER — FAMOTIDINE IN NACL 20-0.9 MG/50ML-% IV SOLN
20.0000 mg | Freq: Once | INTRAVENOUS | Status: AC
  Administered 2012-10-13: 20 mg via INTRAVENOUS
  Filled 2012-10-13: qty 50

## 2012-10-13 MED ORDER — METHYLERGONOVINE MALEATE 0.2 MG/ML IJ SOLN
0.2000 mg | Freq: Once | INTRAMUSCULAR | Status: AC
  Administered 2012-10-13: 0.2 mg via INTRAVENOUS
  Filled 2012-10-13: qty 1

## 2012-10-13 MED ORDER — PROMETHAZINE HCL 25 MG/ML IJ SOLN: 6.2500 mg | INTRAMUSCULAR | Status: DC | PRN

## 2012-10-13 MED ORDER — ONDANSETRON HCL 4 MG/2ML IJ SOLN: 4.0000 mg | Freq: Four times a day (QID) | INTRAMUSCULAR | Status: DC | PRN

## 2012-10-13 MED ORDER — MEPERIDINE HCL 25 MG/ML IJ SOLN: 6.2500 mg | INTRAMUSCULAR | Status: DC | PRN

## 2012-10-13 MED ORDER — MENTHOL 3 MG MT LOZG: 1.0000 | LOZENGE | OROMUCOSAL | Status: DC | PRN

## 2012-10-13 MED ORDER — LACTATED RINGERS IV SOLN: INTRAVENOUS | Status: DC

## 2012-10-13 MED ORDER — DEXAMETHASONE SODIUM PHOSPHATE 4 MG/ML IJ SOLN
INTRAMUSCULAR | Status: DC | PRN
  Administered 2012-10-13: 10 mg via INTRAVENOUS

## 2012-10-13 MED ORDER — SODIUM CHLORIDE 0.9 % IR SOLN
Status: DC | PRN
  Administered 2012-10-13: 1000 mL

## 2012-10-13 MED ORDER — CHLOROPROCAINE HCL 1 % IJ SOLN
INTRAMUSCULAR | Status: DC | PRN
  Administered 2012-10-13: 10 mL

## 2012-10-13 MED ORDER — ONDANSETRON HCL 4 MG/2ML IJ SOLN
INTRAMUSCULAR | Status: DC | PRN
  Administered 2012-10-13: 4 mg via INTRAVENOUS

## 2012-10-13 SURGICAL SUPPLY — 21 items
CATH ROBINSON RED A/P 16FR (CATHETERS) ×2 IMPLANT
CLOTH BEACON ORANGE TIMEOUT ST (SAFETY) ×2 IMPLANT
DECANTER SPIKE VIAL GLASS SM (MISCELLANEOUS) ×2 IMPLANT
GLOVE BIO SURGEON STRL SZ 6.5 (GLOVE) ×2 IMPLANT
GLOVE BIOGEL PI IND STRL 7.0 (GLOVE) ×1 IMPLANT
GLOVE BIOGEL PI INDICATOR 7.0 (GLOVE) ×1
GOWN STRL REIN XL XLG (GOWN DISPOSABLE) ×4 IMPLANT
KIT BERKELEY 1ST TRIMESTER 3/8 (MISCELLANEOUS) ×2 IMPLANT
NDL SPNL 22GX3.5 QUINCKE BK (NEEDLE) ×1 IMPLANT
NEEDLE SPNL 22GX3.5 QUINCKE BK (NEEDLE) ×2 IMPLANT
NS IRRIG 1000ML POUR BTL (IV SOLUTION) ×2 IMPLANT
PACK VAGINAL MINOR WOMEN LF (CUSTOM PROCEDURE TRAY) ×2 IMPLANT
PAD OB MATERNITY 4.3X12.25 (PERSONAL CARE ITEMS) ×2 IMPLANT
PAD PREP 24X48 CUFFED NSTRL (MISCELLANEOUS) ×2 IMPLANT
SET BERKELEY SUCTION TUBING (SUCTIONS) ×2 IMPLANT
SYR CONTROL 10ML LL (SYRINGE) ×2 IMPLANT
TOWEL OR 17X24 6PK STRL BLUE (TOWEL DISPOSABLE) ×4 IMPLANT
VACURETTE 10 RIGID CVD (CANNULA) IMPLANT
VACURETTE 7MM CVD STRL WRAP (CANNULA) ×1 IMPLANT
VACURETTE 8 RIGID CVD (CANNULA) IMPLANT
VACURETTE 9 RIGID CVD (CANNULA) IMPLANT

## 2012-10-13 NOTE — Op Note (Signed)
Susan Andrade, Susan Andrade NO.:  [id number]  MEDICAL RECORD NO.:  [id number]  LOCATION:  9320                          [facility]ACILITY:  [facility]  PHYSICIAN:  Zelphia Cairo, MD    DATE OF BIRTH:  [birth year]  DATE OF PROCEDURE:  10/13/2012 DATE OF DISCHARGE:                              OPERATIVE REPORT   PREOPERATIVE DIAGNOSIS: 1. Heavy vaginal bleeding with clotting. 2. Possible retained products of conception.  POSTOPERATIVE DIAGNOSIS: 1. Heavy vaginal bleeding with clotting. 2. Possible retained products of conception, path pending.  PROCEDURE: 1. Cervical block. 2. Ultrasound-guided D and E.  SURGEON:  Zelphia Cairo, MD  ANESTHESIA:  General.  SPECIMEN:  Curettings with possible products of conception.  COMPLICATIONS:  None.  ESTIMATED BLOOD LOSS:  100 mL.  CONDITION:  Stable to recovery room.  PROCEDURE IN DETAIL:  The patient was taken to the operating room after informed consent was obtained.  She was given general anesthesia. Prepped and draped in sterile fashion and in and out catheter was used to drain her bladder.  Speculum was placed in the vagina and a single- tooth tenaculum was placed on the anterior lip of the cervix.  A 1% Nesacaine was used to perform a cervical block.  The cervix was dilated and a 7-French suction catheter was inserted into the cavity under ultrasound guidance.  Tissue was removed from the cavity.  A gentle curetting was then performed until uterine cry was felt throughout. Suction catheter was reinserted 1 last time to clear any clots and debris.  The uterus was hemostatic speculum was removed.  The cervix was hemostatic speculum was removed and the patient was taken to the recovery room in stable condition.  Sponge, lap, needle, and instrument counts were correct x2.     Zelphia Cairo, MD     GA/MEDQ  D:  10/13/2012  T:  10/13/2012  Job:  [id number]

## 2012-10-13 NOTE — Anesthesia Procedure Notes (Signed)
Procedure Name: LMA Insertion Date/Time: 10/13/2012 5:01 PM Performed by: Graciela Husbands Pre-anesthesia Checklist: Emergency Drugs available, Timeout performed, Suction available, Patient identified and Patient being monitored Patient Re-evaluated:Patient Re-evaluated prior to inductionOxygen Delivery Method: Circle system utilized Preoxygenation: Pre-oxygenation with 100% oxygen Intubation Type: IV induction Ventilation: Mask ventilation without difficulty LMA: LMA with gastric port inserted LMA Size: 4.0 Number of attempts: 1 Placement Confirmation: positive ETCO2 and breath sounds checked- equal and bilateral Tube secured with: Tape Dental Injury: Teeth and Oropharynx as per pre-operative assessment

## 2012-10-13 NOTE — Anesthesia Postprocedure Evaluation (Signed)
Anesthesia Post Note  Patient: Susan Andrade  Procedure(s) Performed: Procedure(s) (LRB): DILATATION AND EVACUATION UNDER ULTRASOUND GUIDENCE (N/A)  Anesthesia type: General  Patient location: PACU  Post pain: Pain level controlled  Post assessment: Post-op Vital signs reviewed  Last Vitals:  Filed Vitals:   10/13/12 1800  BP: 117/67  Pulse: 89  Temp:   Resp: 16    Post vital signs: Reviewed  Level of consciousness: sedated  Complications: No apparent anesthesia complications

## 2012-10-13 NOTE — H&P (Signed)
See MAU provider note with my addendum.  ga

## 2012-10-13 NOTE — Transfer of Care (Signed)
Immediate Anesthesia Transfer of Care Note  Patient: Susan Andrade  Procedure(s) Performed: Procedure(s): DILATATION AND EVACUATION UNDER ULTRASOUND GUIDENCE (N/A)  Patient Location: PACU  Anesthesia Type:General  Level of Consciousness: awake, alert  and oriented  Airway & Oxygen Therapy: Patient Spontanous Breathing and Patient connected to nasal cannula oxygen  Post-op Assessment: Report given to PACU RN and Post -op Vital signs reviewed and stable  Post vital signs: Reviewed and stable  Complications: No apparent anesthesia complications

## 2012-10-13 NOTE — Anesthesia Preprocedure Evaluation (Addendum)
Anesthesia Evaluation  Patient identified by MRN, date of birth, ID band Patient awake    Reviewed: Allergy & Precautions, H&P , NPO status , Patient's Chart, lab work & pertinent test results  Airway Mallampati: II TM Distance: >3 FB Neck ROM: full    Dental no notable dental hx. (+) Teeth Intact   Pulmonary neg pulmonary ROS,    Pulmonary exam normal       Cardiovascular negative cardio ROS      Neuro/Psych negative psych ROS   GI/Hepatic negative GI ROS, Neg liver ROS,   Endo/Other    Renal/GU negative Renal ROS  negative genitourinary   Musculoskeletal negative musculoskeletal ROS (+)   Abdominal (+) + obese,   Peds negative pediatric ROS (+)  Hematology negative hematology ROS (+)   Anesthesia Other Findings   Reproductive/Obstetrics negative OB ROS                           Anesthesia Physical Anesthesia Plan  ASA: II  Anesthesia Plan: General   Post-op Pain Management:    Induction: Intravenous  Airway Management Planned: LMA  Additional Equipment:   Intra-op Plan:   Post-operative Plan:   Informed Consent: I have reviewed the patients History and Physical, chart, labs and discussed the procedure including the risks, benefits and alternatives for the proposed anesthesia with the patient or authorized representative who has indicated his/her understanding and acceptance.   Dental Advisory Given  Plan Discussed with: CRNA and Surgeon  Anesthesia Plan Comments: (1. Will use a Proseal LMA. Patient has received gastric acid prophylaxis. Patient did fine with LMA this past Wednesday.)       Anesthesia Quick Evaluation

## 2012-10-13 NOTE — MAU Provider Note (Signed)
History     CSN: [id number]  Arrival date and time: 10/13/12 1057   None     Chief Complaint  Patient presents with  . Vaginal Bleeding   HPI Susan Andrade is 32 y.o. G2P1011 presents with heavy vaginal bleeding.  Hx of D&E for miscarriage at 11 weeks on 2/19.  Patient of Dr. Lynnell Dike.  Discharged day of surgery.  Presented to MAU at 2am the following date with gushing heavy bleeding.  Moderate bleeding without clots since then until today.  Now having heavier bleeding and more concerning for her the clots.   Intermittent mild cramping.  She had taken Methergine tid since surgery, has 2 more doses left to take.   Last dose last night.   She did call the doctor on call last night because she didn't feel well.     Past Medical History  Diagnosis Date  . Allergy   . Sinusitis-bronchiectasis-situs inversus syndrome   . Polycystic ovary disease   . Guillain-Barre disease   . Leukocytoclastic vasculitis   . Hyperglycemia   . DM (diabetes mellitus) in pregnancy   . Retinal hemorrhage   . GBS (Guillain Barre syndrome)   . GBS (Guillain Barre syndrome)   . Vasculitis   . Gestational diabetes     Past Surgical History  Procedure Laterality Date  . Cesarean section      2010  . Dilation and evacuation N/A 10/10/2012    Procedure: DILATATION AND EVACUATION;  Surgeon: Jeani Hawking, MD;  Location: [facility];  Service: Gynecology;  Laterality: N/A;    Family History  Problem Relation Age of Onset  . Alcohol abuse Other   . Arthritis Other   . Diabetes Other   . Mental illness Other   . Stroke Other   . Coronary artery disease Other   . Pancreatic cancer Father 55  . Melanoma Father     dx in early 12s    History  Substance Use Topics  . Smoking status: Never Smoker   . Smokeless tobacco: Not on file  . Alcohol Use: No    Allergies:  Allergies  Allergen Reactions  . Sulfonamide Derivatives Other (See Comments)    Family history of rxn; pt does not want to take  .  Amoxicillin Rash  . Penicillins Rash  . Sudafed (Pseudoephedrine) Palpitations    Prescriptions prior to admission  Medication Sig Dispense Refill  . cyclobenzaprine (FLEXERIL) 10 MG tablet Take 1 tablet (10 mg total) by mouth 3 (three) times daily as needed for muscle spasms.  30 tablet  0  . HYDROcodone-acetaminophen (NORCO/VICODIN) 5-325 MG per tablet Take 2 tablets by mouth every 6 (six) hours as needed for pain.  20 tablet  0  . ketorolac (TORADOL) 10 MG tablet Take 1 tablet (10 mg total) by mouth every 6 (six) hours as needed for pain.  20 tablet  0  . methylergonovine (METHERGINE) 0.2 MG tablet Take 1 tablet (0.2 mg total) by mouth every 6 (six) hours.  9 tablet  0  . ONETOUCH DELICA LANCETS MISC Test blood sugars twice a day as needed  100 each  5  . Prenatal Vit-Fe Fumarate-FA (PRENATAL MULTIVITAMIN) TABS Take 1 tablet by mouth at bedtime.        Review of Systems  Constitutional: Negative.   HENT: Negative.   Respiratory: Negative.   Cardiovascular: Negative.   Gastrointestinal: Positive for abdominal pain (intermittent mild cramps).  Genitourinary:       Vaginal bleeding  with clots   Physical Exam   Blood pressure 134/83, pulse 135, temperature 98.4 F (36.9 C), temperature source Oral, resp. rate 18, last menstrual period 07/24/2012, unknown if currently breastfeeding.  Physical Exam  Nursing note and vitals reviewed. Constitutional: She is oriented to person, place, and time. She appears well-developed and well-nourished. No distress (patient upset).  HENT:  Head: Normocephalic.  Neck: Normal range of motion.  Cardiovascular:  tachycardic  GI: Soft.  Neurological: She is alert and oriented to person, place, and time.  Skin: Skin is warm and dry. There is pallor.  Psychiatric: She has a normal mood and affect. Her behavior is normal.   Results for orders placed during the hospital encounter of 10/13/12 (from the past 24 hour(s))  CBC     Status: Abnormal    Collection Time    10/13/12 11:37 AM      Result Value Range   WBC 11.5 (*) 4.0 - 10.5 K/uL   RBC 4.26  3.87 - 5.11 MIL/uL   Hemoglobin 11.8 (*) 12.0 - 15.0 g/dL   HCT 16.1 (*) 09.6 - 04.5 %   MCV 81.0  78.0 - 100.0 fL   MCH 27.7  26.0 - 34.0 pg   MCHC 34.2  30.0 - 36.0 g/dL   RDW 40.9  81.1 - 91.4 %   Platelets 253  150 - 400 K/uL  Clinical Data: Vaginal bleeding after dilatation and evacuation  10/10/2012.  TRANSVAGINAL ULTRASOUND OF PELVIS  Technique: Transvaginal ultrasound examination of the pelvis was  performed including evaluation of the uterus, ovaries, adnexal  regions, and pelvic cul-de-sac.  Comparison: 10/05/2012  Findings:  Uterus: 12.6 x 7.0 x 8.2 cm.  Endometrium: Normal in thickness, 10 mm. Within the endometrial  canal, there is a large amount of complex material. This is  extending into the endocervical region and being passed into the  vagina. No vascularity is identified within this material.  Right ovary: Not visualized.  Left ovary: Not visualized.  Other Findings: No significant free fluid.  IMPRESSION:  Large volume complex material within the endometrium, most  consistent with hematoma. This is passing through the endocervix  and into the vagina. Given the volume of endometrial material,  retained products cannot be entirely excluded. However, there is  no vascularity or specific findings to confirm such.  Lack of visualization of the ovaries.  Original Report Authenticated By: Jeronimo Greaves, M.D.  MAU Course  Procedures  MDM 11:25  Reported D&E to Dr. Renaldo Fiddler.  Order given to keep patient NPO, Ultrasound.  Explained Plan of care to patient. 12:43  Reported lab and ultrasound to Dr. Renaldo Fiddler.  She is coming in to talk with the patient  Assessment and Plan  A:  Heavy bleeding with clot after D&E post op day 3  P:  Dr. Renaldo Fiddler coming in to see patient.   KEY,EVE M 10/13/2012, 11:19 AM

## 2012-10-13 NOTE — MAU Provider Note (Signed)
Pt presents w/ continued vb and passing clots.  No f/c.  POD3 from D&E for missed AB.  Has continued to have moderate bleeding w/ clots and cramping postoperatively despite PO methergine as an outpatient.  Denies FHx of bleeding d/o.  Denies h/o bleeding after previous c-seciton or dental surgeries.  Hgb 11.8 US shows moderate clot and tissue within the endometrium  Offered pt overnight obs w/ IVF, methergine and rpt labs/US in the am vs D&C.  R/b/a of both discussed with pt including risk of bleeding requiring blood transfusion, infection, adhesions, and uterine perforation. All of her questions were answered and informed consent obtained.

## 2012-10-13 NOTE — MAU Note (Signed)
Pt reports she had D&E on Wed. Went home and came back that night for heavy bleeding. Given methergine to take at home. Started bleeding heavy again this morning with clots. Pt feels a little dizzy and light headed.

## 2012-10-14 LAB — TYPE AND SCREEN
ABO/RH(D): O POS
Unit division: 0

## 2012-10-14 LAB — CBC
HCT: 34.3 % — ABNORMAL LOW (ref 36.0–46.0)
Platelets: 297 10*3/uL (ref 150–400)
RBC: 4.29 MIL/uL (ref 3.87–5.11)
RDW: 13.1 % (ref 11.5–15.5)
WBC: 7.9 10*3/uL (ref 4.0–10.5)

## 2012-10-14 MED ORDER — METHYLERGONOVINE MALEATE 0.2 MG PO TABS: 0.2000 mg | ORAL_TABLET | Freq: Three times a day (TID) | ORAL | Status: DC

## 2012-10-14 NOTE — Anesthesia Postprocedure Evaluation (Signed)
  Anesthesia Post-op Note  Patient: Susan Andrade  Procedure(s) Performed: Procedure(s): DILATATION AND EVACUATION UNDER ULTRASOUND GUIDENCE (N/A)  Patient Location: [facility]  Anesthesia Type:General  Level of Consciousness: awake, alert  and oriented  Airway and Oxygen Therapy: Patient Spontanous Breathing  Post-op Pain: mild  Post-op Assessment: Post-op Vital signs reviewed and Patient's Cardiovascular Status Stable  Post-op Vital Signs: Reviewed and stable  Complications: No apparent anesthesia complications

## 2012-10-14 NOTE — Progress Notes (Signed)
Pt feeling much better w/ minimal VB overnight.  AF, VSS Gen - NAD Abd 0 soft, NT   A/P:  D/c home Methergine x 24 hrs

## 2012-10-14 NOTE — Discharge Summary (Signed)
Physician Discharge Summary  Patient ID: KIMBALL MANSKE MRN: [id number] DOB/AGE: 33-Mar-1982 33 y.o.  Admit date: 10/13/2012 Discharge date: 10/14/2012  Admission Diagnoses:  Retained POC   Discharge Diagnoses:  Active Problems:   * No active hospital problems. *   Discharged Condition: good  Hospital Course: pt was admitted postoperatively for observation because her concern for VB recurrence.  VB remained stable and she was ambulating and tolerating a regular diet.  POD1 hgb was stable.  Consults: None  Significant Diagnostic Studies: labs: cbc  Treatments: surgery: D&E  Discharge Exam: Blood pressure 108/77, pulse 90, temperature 98.2 F (36.8 C), temperature source Oral, resp. rate 17, height 5\' 5"  (1.651 m), weight 92.987 kg (205 lb), last menstrual period 07/24/2012, SpO2 99.00%. General appearance: alert and cooperative GI: soft, non-tender; bowel sounds normal; no masses,  no organomegaly  Disposition: 01-Home or Self Care     Medication List    TAKE these medications       cyclobenzaprine 10 MG tablet  Commonly known as:  FLEXERIL  Take 1 tablet (10 mg total) by mouth 3 (three) times daily as needed for muscle spasms.     HYDROcodone-acetaminophen 5-325 MG per tablet  Commonly known as:  NORCO/VICODIN  Take 2 tablets by mouth every 6 (six) hours as needed for pain.     ketorolac 10 MG tablet  Commonly known as:  TORADOL  Take 1 tablet (10 mg total) by mouth every 6 (six) hours as needed for pain.     methylergonovine 0.2 MG tablet  Commonly known as:  METHERGINE  Take 1 tablet (0.2 mg total) by mouth every 6 (six) hours.     methylergonovine 0.2 MG tablet  Commonly known as:  METHERGINE  Take 1 tablet (0.2 mg total) by mouth 3 (three) times daily.     ONETOUCH DELICA LANCETS Misc  Test blood sugars twice a day as needed     prenatal multivitamin Tabs  Take 1 tablet by mouth at bedtime.           Follow-up Information   Follow up with  GREWAL,MICHELLE L, MD. Schedule an appointment as soon as possible for a visit in 1 week.   Contact information:   [address] [place] [address] [phone]       Signed: Zelphia Cairo 10/14/2012, 9:21 AM

## 2012-10-14 NOTE — Progress Notes (Signed)
Discharge instructions reviewed with patient and spouse.  Both state understanding of home care, signs/symptoms to report to MD, medications, activity and return MD visit.  No home equipment.  Patient discharged via wheelchair with staff in stable conditions without incident.

## 2012-10-15 ENCOUNTER — Encounter (HOSPITAL_COMMUNITY): Payer: Self-pay | Admitting: Obstetrics and Gynecology

## 2012-10-17 LAB — TYPE AND SCREEN
ABO/RH(D): O POS
Antibody Screen: NEGATIVE
Unit division: 0

## 2013-06-10 ENCOUNTER — Other Ambulatory Visit (HOSPITAL_COMMUNITY): Payer: Self-pay | Admitting: Obstetrics and Gynecology

## 2013-06-10 DIAGNOSIS — Z3141 Encounter for fertility testing: Secondary | ICD-10-CM

## 2013-06-14 ENCOUNTER — Ambulatory Visit (HOSPITAL_COMMUNITY)
Admission: RE | Admit: 2013-06-14 | Discharge: 2013-06-14 | Disposition: A | Discharge status: Home / Self Care | Payer: BC Managed Care – PPO | Source: Ambulatory Visit | Attending: Obstetrics and Gynecology | Admitting: Obstetrics and Gynecology

## 2013-06-14 DIAGNOSIS — Z3141 Encounter for fertility testing: Secondary | ICD-10-CM

## 2013-06-14 DIAGNOSIS — N979 Female infertility, unspecified: Secondary | ICD-10-CM | POA: Insufficient documentation

## 2013-06-14 MED ORDER — IOHEXOL 300 MG/ML  SOLN
10.0000 mL | Freq: Once | INTRAMUSCULAR | Status: AC | PRN
  Administered 2013-06-14: 12 mL

## 2013-06-27 ENCOUNTER — Other Ambulatory Visit: Payer: Self-pay

## 2013-08-22 NOTE — L&D Delivery Note (Signed)
Delivery Note At 8:40 PM a viable female was delivered via Vaginal, Spontaneous Delivery OA.  APGAR: 4, 7; weight  pending Placenta status:spontaneously with 3 vessel cord  , .  Cord: 3 vessels with the following complications:none .  Cord pH: not obtained  Anesthesia: Epidural  Episiotomy: None Lacerations: 2nd Suture Repair: 3.0 chromic Est. Blood Loss (mL): 300  Mom to postpartum.  Baby to Couplet care / Skin to Skin.  Susan Andrade L 03/25/2014, 9:18 PM

## 2013-09-07 ENCOUNTER — Encounter (HOSPITAL_COMMUNITY): Payer: Self-pay | Admitting: *Deleted

## 2013-09-07 ENCOUNTER — Inpatient Hospital Stay (HOSPITAL_COMMUNITY)
Admission: AD | Admit: 2013-09-07 | Discharge: 2013-09-07 | Disposition: A | Discharge status: Home / Self Care | Payer: BC Managed Care – PPO | Source: Ambulatory Visit | Attending: Obstetrics and Gynecology | Admitting: Obstetrics and Gynecology

## 2013-09-07 DIAGNOSIS — IMO0002 Reserved for concepts with insufficient information to code with codable children: Secondary | ICD-10-CM | POA: Insufficient documentation

## 2013-09-07 DIAGNOSIS — E860 Dehydration: Secondary | ICD-10-CM

## 2013-09-07 DIAGNOSIS — E119 Type 2 diabetes mellitus without complications: Secondary | ICD-10-CM | POA: Insufficient documentation

## 2013-09-07 DIAGNOSIS — O211 Hyperemesis gravidarum with metabolic disturbance: Secondary | ICD-10-CM | POA: Insufficient documentation

## 2013-09-07 DIAGNOSIS — O219 Vomiting of pregnancy, unspecified: Secondary | ICD-10-CM

## 2013-09-07 DIAGNOSIS — O210 Mild hyperemesis gravidarum: Secondary | ICD-10-CM

## 2013-09-07 DIAGNOSIS — O24919 Unspecified diabetes mellitus in pregnancy, unspecified trimester: Secondary | ICD-10-CM | POA: Insufficient documentation

## 2013-09-07 LAB — URINALYSIS, ROUTINE W REFLEX MICROSCOPIC
GLUCOSE, UA: NEGATIVE mg/dL
HGB URINE DIPSTICK: NEGATIVE
Leukocytes, UA: NEGATIVE
Nitrite: NEGATIVE
PROTEIN: NEGATIVE mg/dL
Specific Gravity, Urine: >1.03 — ABNORMAL HIGH (ref 1.005–1.030)
Urobilinogen, UA: 0.2 mg/dL (ref 0.0–1.0)
pH: 6 (ref 5.0–8.0)

## 2013-09-07 LAB — POCT PREGNANCY, URINE: Preg Test, Ur: POSITIVE — AB

## 2013-09-07 MED ORDER — ONDANSETRON 8 MG PO TBDP
8.0000 mg | ORAL_TABLET | Freq: Once | ORAL | Status: AC
  Administered 2013-09-07: 8 mg via ORAL
  Filled 2013-09-07: qty 1

## 2013-09-07 MED ORDER — PROMETHAZINE HCL 25 MG/ML IJ SOLN
25.0000 mg | INTRAMUSCULAR | Status: DC
  Filled 2013-09-07: qty 1

## 2013-09-07 MED ORDER — LACTATED RINGERS IV BOLUS (SEPSIS)
1000.0000 mL | Freq: Once | INTRAVENOUS | Status: AC
Start: 2013-09-07 — End: 2013-09-07
  Administered 2013-09-07: 1000 mL via INTRAVENOUS

## 2013-09-07 NOTE — MAU Provider Note (Signed)
Chief Complaint: Nausea, Fatigue and Emesis   None    SUBJECTIVE HPI: Susan Andrade is a 33 y.o. G2P1011 at [redacted]w[redacted]d who presents with N/V and feeling fatigued and malaise since last night. Vomited once last night and once this morning but has not eaten all day and retained little fluids. Has taken no meds for nausea. Requests flu test. Has had chills; no fever, no cough or sore throat.   Past Medical History  Diagnosis Date  . Allergy   . Sinusitis-bronchiectasis-situs inversus syndrome   . Polycystic ovary disease   . Guillain-Barre disease   . Leukocytoclastic vasculitis   . Hyperglycemia   . DM (diabetes mellitus) in pregnancy   . Retinal hemorrhage   . GBS (Guillain Barre syndrome)   . GBS (Guillain Barre syndrome)   . Vasculitis   . Gestational diabetes    OB History  Gravida Para Term Preterm AB SAB TAB Ectopic Multiple Living  2 1 1  0 1 1 0 0 0 1    # Outcome Date GA Lbr Len/2nd Weight Sex Delivery Anes PTL Lv  2 TRM 08/20/09    M LTCS EPI  Y  1 SAB              Comments: System Generated. Please review and update pregnancy details.     Past Surgical History  Procedure Laterality Date  . Cesarean section      2010  . Dilation and evacuation N/A 10/10/2012    Procedure: DILATATION AND EVACUATION;  Surgeon: Cyril Mourning, MD;  Location: [facility];  Service: Gynecology;  Laterality: N/A;  . Dilation and evacuation N/A 10/13/2012    Procedure: DILATATION AND EVACUATION UNDER ULTRASOUND GUIDENCE;  Surgeon: Marylynn Pearson, MD;  Location: [facility];  Service: Gynecology;  Laterality: N/A;   History   Social History  . Marital Status: Married    Spouse Name: N/A    Number of Children: N/A  . Years of Education: N/A   Occupational History  . [employer]    Social History Main Topics  . Smoking status: Never Smoker   . Smokeless tobacco: Not on file  . Alcohol Use: No  . Drug Use: No  . Sexual Activity: Yes    Partners: Male   Other Topics Concern  . Not on file    Social History Narrative  . No narrative on file   No current facility-administered medications on file prior to encounter.   Current Outpatient Prescriptions on File Prior to Encounter  Medication Sig Dispense Refill  . ONETOUCH DELICA LANCETS MISC Test blood sugars twice a day as needed  100 each  5  . Prenatal Vit-Fe Fumarate-FA (PRENATAL MULTIVITAMIN) TABS Take 1 tablet by mouth at bedtime.       Allergies  Allergen Reactions  . Sulfonamide Derivatives Other (See Comments)    Family history of rxn; pt does not want to take  . Amoxicillin Rash  . Latex Rash  . Penicillins Rash  . Sudafed [Pseudoephedrine] Palpitations    ROS: Pertinent items in HPI  OBJECTIVE There were no vitals taken for this visit. GENERAL: Well-developed, well-nourished female in no acute distress.  HEENT: Normocephalic HEART: normal rate RESP: normal effort ABDOMEN: Soft, obese, non-tender EXTREMITIES: Nontender, no edema NEURO: Alert and oriented  LAB RESULTS Results for orders placed during the hospital encounter of 09/07/13 (from the past 24 hour(s))  URINALYSIS, ROUTINE W REFLEX MICROSCOPIC     Status: Abnormal   Collection Time  09/07/13  4:39 PM      Result Value Range   Color, Urine AMBER (*) YELLOW   APPearance HAZY (*) CLEAR   Specific Gravity, Urine >1.030 (*) 1.005 - 1.030   pH 6.0  5.0 - 8.0   Glucose, UA NEGATIVE  NEGATIVE mg/dL   Hgb urine dipstick NEGATIVE  NEGATIVE   Bilirubin Urine SMALL (*) NEGATIVE   Ketones, ur >80 (*) NEGATIVE mg/dL   Protein, ur NEGATIVE  NEGATIVE mg/dL   Urobilinogen, UA 0.2  0.0 - 1.0 mg/dL   Nitrite NEGATIVE  NEGATIVE   Leukocytes, UA NEGATIVE  NEGATIVE    IMAGING No results found.  MAU COURSE Zofran ODT 8mg  at 1715 IV LR  at 1815 IV  #2 at 2015  Has retained Sprite and graham cracker  ASSESSMENT 1. Nausea and vomiting in pregnancy prior to [redacted] weeks gestation   2. Dehydration   Type 2 DM G3P1011 at [redacted]w[redacted]d  PLAN Discharge home     Medication List         metFORMIN 750 MG 24 hr tablet  Commonly known as:  GLUCOPHAGE-XR  Take 750 mg by mouth 2 (two) times daily.     ONETOUCH DELICA LANCETS Misc  Test blood sugars twice a day as needed     prenatal multivitamin Tabs tablet  Take 1 tablet by mouth daily at 12 noon.     UNISOM PO  Take 1 tablet by mouth daily.       Follow-up Information   Follow up with [facility], [place]. On 09/10/2013.   Contact information:   [address] [place] [id number] [phone]      Follow up with Cyril Mourning, MD.   Specialty:  Obstetrics and Gynecology   Contact information:   [place] [address] [phone]        Lorene Dy, CNM 09/07/2013  4:53 PM

## 2013-09-07 NOTE — Discharge Instructions (Signed)
Dehydration, Adult Dehydration is when you lose more fluids from the body than you take in. Vital organs like the kidneys, brain, and heart cannot function without a proper amount of fluids and salt. Any loss of fluids from the body can cause dehydration.  CAUSES   Vomiting.  Diarrhea.  Excessive sweating.  Excessive urine output.  Fever. SYMPTOMS  Mild dehydration  Thirst.  Dry lips.  Slightly dry mouth. Moderate dehydration  Very dry mouth.  Sunken eyes.  Skin does not bounce back quickly when lightly pinched and released.  Dark urine and decreased urine production.  Decreased tear production.  Headache. Severe dehydration  Very dry mouth.  Extreme thirst.  Rapid, weak pulse (more than 100 beats per minute at rest).  Cold hands and feet.  Not able to sweat in spite of heat and temperature.  Rapid breathing.  Blue lips.  Confusion and lethargy.  Difficulty being awakened.  Minimal urine production.  No tears. DIAGNOSIS  Your caregiver will diagnose dehydration based on your symptoms and your exam. Blood and urine tests will help confirm the diagnosis. The diagnostic evaluation should also identify the cause of dehydration. TREATMENT  Treatment of mild or moderate dehydration can often be done at home by increasing the amount of fluids that you drink. It is best to drink small amounts of fluid more often. Drinking too much at one time can make vomiting worse. Refer to the home care instructions below. Severe dehydration needs to be treated at the hospital where you will probably be given intravenous (IV) fluids that contain water and electrolytes. HOME CARE INSTRUCTIONS   Ask your caregiver about specific rehydration instructions.  Drink enough fluids to keep your urine clear or pale yellow.  Drink small amounts frequently if you have nausea and vomiting.  Eat as you normally do.  Avoid:  Foods or drinks high in sugar.  Carbonated  drinks.  Juice.  Extremely hot or cold fluids.  Drinks with caffeine.  Fatty, greasy foods.  Alcohol.  Tobacco.  Overeating.  Gelatin desserts.  Wash your hands well to avoid spreading bacteria and viruses.  Only take over-the-counter or prescription medicines for pain, discomfort, or fever as directed by your caregiver.  Ask your caregiver if you should continue all prescribed and over-the-counter medicines.  Keep all follow-up appointments with your caregiver. SEEK MEDICAL CARE IF:  You have abdominal pain and it increases or stays in one area (localizes).  You have a rash, stiff neck, or severe headache.  You are irritable, sleepy, or difficult to awaken.  You are weak, dizzy, or extremely thirsty. SEEK IMMEDIATE MEDICAL CARE IF:   You are unable to keep fluids down or you get worse despite treatment.  You have frequent episodes of vomiting or diarrhea.  You have blood or green matter (bile) in your vomit.  You have blood in your stool or your stool looks black and tarry.  You have not urinated in 6 to 8 hours, or you have only urinated a small amount of very dark urine.  You have a fever.  You faint. MAKE SURE YOU:   Understand these instructions.  Will watch your condition.  Will get help right away if you are not doing well or get worse. Document Released: 08/08/2005 Document Revised: 10/31/2011 Document Reviewed: 03/28/2011 [facility] Patient Information 2014 [place].  Dehydration, Adult Dehydration is when you lose more fluids from the body than you take in. Vital organs like the kidneys, brain, and heart cannot function without  a proper amount of fluids and salt. Any loss of fluids from the body can cause dehydration.  CAUSES   Vomiting.  Diarrhea.  Excessive sweating.  Excessive urine output.  Fever. SYMPTOMS  Mild dehydration  Thirst.  Dry lips.  Slightly dry mouth. Moderate dehydration  Very dry mouth.  Sunken  eyes.  Skin does not bounce back quickly when lightly pinched and released.  Dark urine and decreased urine production.  Decreased tear production.  Headache. Severe dehydration  Very dry mouth.  Extreme thirst.  Rapid, weak pulse (more than 100 beats per minute at rest).  Cold hands and feet.  Not able to sweat in spite of heat and temperature.  Rapid breathing.  Blue lips.  Confusion and lethargy.  Difficulty being awakened.  Minimal urine production.  No tears. DIAGNOSIS  Your caregiver will diagnose dehydration based on your symptoms and your exam. Blood and urine tests will help confirm the diagnosis. The diagnostic evaluation should also identify the cause of dehydration. TREATMENT  Treatment of mild or moderate dehydration can often be done at home by increasing the amount of fluids that you drink. It is best to drink small amounts of fluid more often. Drinking too much at one time can make vomiting worse. Refer to the home care instructions below. Severe dehydration needs to be treated at the hospital where you will probably be given intravenous (IV) fluids that contain water and electrolytes. HOME CARE INSTRUCTIONS   Ask your caregiver about specific rehydration instructions.  Drink enough fluids to keep your urine clear or pale yellow.  Drink small amounts frequently if you have nausea and vomiting.  Eat as you normally do.  Avoid:  Foods or drinks high in sugar.  Carbonated drinks.  Juice.  Extremely hot or cold fluids.  Drinks with caffeine.  Fatty, greasy foods.  Alcohol.  Tobacco.  Overeating.  Gelatin desserts.  Wash your hands well to avoid spreading bacteria and viruses.  Only take over-the-counter or prescription medicines for pain, discomfort, or fever as directed by your caregiver.  Ask your caregiver if you should continue all prescribed and over-the-counter medicines.  Keep all follow-up appointments with your  caregiver. SEEK MEDICAL CARE IF:  You have abdominal pain and it increases or stays in one area (localizes).  You have a rash, stiff neck, or severe headache.  You are irritable, sleepy, or difficult to awaken.  You are weak, dizzy, or extremely thirsty. SEEK IMMEDIATE MEDICAL CARE IF:   You are unable to keep fluids down or you get worse despite treatment.  You have frequent episodes of vomiting or diarrhea.  You have blood or green matter (bile) in your vomit.  You have blood in your stool or your stool looks black and tarry.  You have not urinated in 6 to 8 hours, or you have only urinated a small amount of very dark urine.  You have a fever.  You faint. MAKE SURE YOU:   Understand these instructions.  Will watch your condition.  Will get help right away if you are not doing well or get worse. Document Released: 08/08/2005 Document Revised: 10/31/2011 Document Reviewed: 03/28/2011 [facility] Patient Information 2014 [place], [place]. Hyperemesis Gravidarum Hyperemesis gravidarum is a severe form of nausea and vomiting that happens during pregnancy. Hyperemesis is worse than morning sickness. It may cause you to have nausea or vomiting all day for many days. It may keep you from eating and drinking enough food and liquids. Hyperemesis usually occurs during the first  half (the first 20 weeks) of pregnancy. It often goes away once a woman is in her second half of pregnancy. However, sometimes hyperemesis continues through an entire pregnancy.  CAUSES  The cause of this condition is not completely known but is thought to be related to changes in the body's hormones when pregnant. It could be from the high level of the pregnancy hormone or an increase in estrogen in the body.  SIGNS AND SYMPTOMS   Severe nausea and vomiting.  Nausea that does not go away.  Vomiting that does not allow you to keep any food down.  Weight loss and body fluid loss (dehydration).  Having no  desire to eat or not liking food you have previously enjoyed. DIAGNOSIS  Your health care provider will do a physical exam and ask you about your symptoms. He or she may also order blood tests and urine tests to make sure something else is not causing the problem.  TREATMENT  You may only need medicine to control the problem. If medicines do not control the nausea and vomiting, you will be treated in the hospital to prevent dehydration, increased acid in the blood (acidosis), weight loss, and changes in the electrolytes in your body that may harm the unborn baby (fetus). You may need IV fluids.  HOME CARE INSTRUCTIONS   Only take over-the-counter or prescription medicines as directed by your health care provider.  Try eating a couple of dry crackers or toast in the morning before getting out of bed.  Avoid foods and smells that upset your stomach.  Avoid fatty and spicy foods.  Eat 5 6 small meals a day.  Do not drink when eating meals. Drink between meals.  For snacks, eat high-protein foods, such as cheese.  Eat or suck on things that have ginger in them. Ginger helps nausea.  Avoid food preparation. The smell of food can spoil your appetite.  Avoid iron pills and iron in your multivitamins until after 3 4 months of being pregnant. However, consult with your health care provider before stopping any prescribed iron pills. SEEK MEDICAL CARE IF:   Your abdominal pain increases.  You have a severe headache.  You have vision problems.  You are losing weight. SEEK IMMEDIATE MEDICAL CARE IF:   You are unable to keep fluids down.  You vomit blood.  You have constant nausea and vomiting.  You have excessive weakness.  You have extreme thirst.  You have dizziness or fainting.  You have a fever or persistent symptoms for more than 2 3 days.  You have a fever and your symptoms suddenly get worse. MAKE SURE YOU:   Understand these instructions.  Will watch your  condition.  Will get help right away if you are not doing well or get worse. Document Released: 08/08/2005 Document Revised: 05/29/2013 Document Reviewed: 03/20/2013 [facility] Patient Information 2014 [place].

## 2013-09-07 NOTE — MAU Note (Signed)
Pt. Here for vomiting that she had last night and this am. Overall feeling of weakness. Pt. Had chills last night. Denies fever. Has not take any medications at home. Pt. Is also having irregular cramping. Denies any bleeding or leakage of fluid.  Pt. States she is high risk and had an ultrasound last week.

## 2013-09-07 NOTE — MAU Note (Signed)
Pt. States she had a bad experience with her last ER visit and IV start. IV start attempted x1. Unable to get. CRNA called. CRNA here. Pt.s BP dropped and O2 was administerd via [place] at 4L. BP checked and normal at this time. Pulse ox 100%.

## 2013-09-07 NOTE — MAU Note (Signed)
Mask given and applied, pt began feeling claustrophobic wearing mask. Took pt to room.

## 2013-09-07 NOTE — MAU Note (Signed)
Pt states here for fatigue, and n/v. Has vomited once yesterday pm and this am once as well. Denies abnormal vaginal d/c or bleeding. Chills last pm.

## 2013-09-10 ENCOUNTER — Other Ambulatory Visit: Payer: Self-pay | Admitting: Obstetrics and Gynecology

## 2013-12-11 ENCOUNTER — Ambulatory Visit (INDEPENDENT_AMBULATORY_CARE_PROVIDER_SITE_OTHER): Payer: BC Managed Care – PPO | Admitting: Cardiology

## 2013-12-11 ENCOUNTER — Encounter: Payer: Self-pay | Admitting: Cardiology

## 2013-12-11 VITALS — BP 102/68 | HR 116 | Ht 65.0 in | Wt 223.1 lb

## 2013-12-11 DIAGNOSIS — R002 Palpitations: Secondary | ICD-10-CM

## 2013-12-11 DIAGNOSIS — Q249 Congenital malformation of heart, unspecified: Secondary | ICD-10-CM

## 2013-12-11 NOTE — Patient Instructions (Signed)
The current medical regimen is effective;  continue present plan and medications.  Your physician has recommended that you wear a holter monitor. Holter monitors are medical devices that record the heart's electrical activity. Doctors most often use these monitors to diagnose arrhythmias. Arrhythmias are problems with the speed or rhythm of the heartbeat. The monitor is a small, portable device. You can wear one while you do your normal daily activities. This is usually used to diagnose what is causing palpitations/syncope (passing out).  Follow in early August with Dr Percival Spanish.

## 2013-12-11 NOTE — Progress Notes (Signed)
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  She saw me in 2011 during a previous pregnancy. She had an unremarkable pregnancy although she did require a C-section for noncardiac reasons. She did have gestational diabetes and now has hyperglycemia. She is [redacted] weeks pregnant now. She has done well as far and has had multiple ultrasounds and there are thought to be no abnormalities. She denies any ongoing cardiovascular symptoms. She does not exercise routinely but she does take care of her 69-year-old. With this level activity she denies any chest pressure, neck or arm discomfort. She doesn't notice any palpitations, presyncope or syncope. She has no PND or orthopnea. She does feel a little bit of dyspnea with exertion but she relates this to being deconditioned and overweight.   Allergies  Allergen Reactions  . Sulfonamide Derivatives Other (See Comments)    Family history of rxn; pt does not want to take  . Amoxicillin Rash  . Latex Rash  . Penicillins Rash  . Sudafed [Pseudoephedrine] Palpitations    Current Outpatient Prescriptions  Medication Sig Dispense Refill  . metFORMIN (GLUCOPHAGE-XR) 750 MG 24 hr tablet Take 750 mg by mouth 2 (two) times daily.      Glory Rosebush DELICA LANCETS MISC Test blood sugars twice a day as needed  100 each  5  . Prenatal Vit-Fe Fumarate-FA (PRENATAL MULTIVITAMIN) TABS tablet Take 1 tablet by mouth daily at 12 noon.       No current facility-administered medications for this visit.    Past Medical History  Diagnosis Date  . Allergy   . Sinusitis-bronchiectasis-situs inversus syndrome   . Polycystic ovary disease   . Leukocytoclastic vasculitis   . Hyperglycemia   . DM (diabetes mellitus) in pregnancy   . Retinal hemorrhage   . GBS (Guillain Barre syndrome)   . Vasculitis   . Gestational diabetes     Past Surgical History  Procedure Laterality Date  . Cesarean section      2010  . Dilation  and evacuation N/A 10/10/2012    Procedure: DILATATION AND EVACUATION;  Surgeon: Cyril Mourning, MD;  Location: [facility];  Service: Gynecology;  Laterality: N/A;  . Dilation and evacuation N/A 10/13/2012    Procedure: DILATATION AND EVACUATION UNDER ULTRASOUND GUIDENCE;  Surgeon: Marylynn Pearson, MD;  Location: [facility];  Service: Gynecology;  Laterality: N/A;    Family History  Problem Relation Age of Onset  . Alcohol abuse Other   . Arthritis Other   . Diabetes Other   . Mental illness Other   . Stroke Other   . Coronary artery disease Other   . Pancreatic cancer Father 81  . Melanoma Father     dx in early 70s    History   Social History  . Marital Status: Married    Spouse Name: N/A    Number of Children: N/A  . Years of Education: N/A   Occupational History  . [employer]    Social History Main Topics  . Smoking status: Never Smoker   . Smokeless tobacco: Never Used  . Alcohol Use: No  . Drug Use: No  . Sexual Activity: Yes    Partners: Male   Other Topics Concern  . Not on file   Social History Narrative  . No narrative on file    ROS:   As stated in the HPI and negative for all other systems.  PHYSICAL EXAM BP 102/68  Pulse 116  Ht 5\' 5"  (1.651 m)  Wt 223 lb 1.6 oz (101.197 kg)  BMI 37.13 kg/m2 GENERAL:  Well appearing HEENT:  Pupils equal round and reactive, fundi not visualized, oral mucosa unremarkable NECK:  No jugular venous distention, waveform within normal limits, carotid upstroke brisk and symmetric, no bruits, no thyromegaly LYMPHATICS:  No cervical, inguinal adenopathy LUNGS:  Clear to auscultation bilaterally BACK:  No CVA tenderness CHEST:  Unremarkable HEART:  PMI is on the right side ,S1 and S2 within normal limits, no S3, no S4, no clicks, no rubs, no murmurs ABD:  Flat, positive bowel sounds normal in frequency in pitch, no bruits, no rebound, no guarding, no midline pulsatile mass, no hepatomegaly, no splenomegaly EXT:  2 plus pulses  throughout, no edema, no cyanosis no clubbing SKIN:  No rashes no nodules NEURO:  Cranial nerves II through XII grossly intact, motor grossly intact throughout PSYCH:  Cognitively intact, oriented to person place and time  EKG:   (Right sided leads)  Sinus tachycardia, rate 116, left axis deviation, poor anterior R wave progression, no acute ST-T wave changes, no change from previous.  12/11/2013  ASSESSMENT AND PLAN  SITUS INVERSUS:    The patient has had no symptoms related to this and no associated congenital abnormalities. She did well with her last pregnancy. She's had no new symptoms. At this point no further cardiovascular testing is suggested although if she has any change in symptoms of shortness of breath I would have a low threshold for repeat echocardiography.  TACHYCARDIA:  The patient has sinus tachycardia. She reports a normal thyroid test. I will apply a 24-hour Holter to make sure she has normal weight sleep heart rate variation.

## 2013-12-13 ENCOUNTER — Encounter: Payer: Self-pay | Admitting: Radiology

## 2013-12-13 ENCOUNTER — Encounter (INDEPENDENT_AMBULATORY_CARE_PROVIDER_SITE_OTHER): Payer: BC Managed Care – PPO

## 2013-12-13 DIAGNOSIS — R002 Palpitations: Secondary | ICD-10-CM

## 2013-12-13 DIAGNOSIS — Q249 Congenital malformation of heart, unspecified: Secondary | ICD-10-CM

## 2013-12-13 NOTE — Progress Notes (Signed)
Patient ID: Susan Andrade, female   DOB: 1980-09-29, 33 y.o.   MRN: [id number] E cardio 24hr holter applied

## 2013-12-24 ENCOUNTER — Telehealth: Payer: Self-pay | Admitting: Cardiology

## 2013-12-24 NOTE — Telephone Encounter (Signed)
New Prob   Pt calling following up on monitor results. Please call.

## 2013-12-24 NOTE — Telephone Encounter (Signed)
Dr Percival Spanish has results - I will get from him when he comes into the office this afternoon.

## 2013-12-25 NOTE — Telephone Encounter (Signed)
Reviewed results of holter and Dr Hochrein's order for BNP r/t her SOB with exertion.  Pt states she will be happy to do the bloodwork but at this time she really doesn't feel SOB.  Advised she can wait to have blood draw but to monitor and call if she starts having SOB.  She is in agreement.

## 2013-12-26 ENCOUNTER — Encounter (HOSPITAL_COMMUNITY): Payer: Self-pay | Admitting: *Deleted

## 2013-12-26 ENCOUNTER — Inpatient Hospital Stay (HOSPITAL_COMMUNITY)
Admission: AD | Admit: 2013-12-26 | Discharge: 2013-12-26 | Disposition: A | Discharge status: Home / Self Care | Payer: BC Managed Care – PPO | Source: Ambulatory Visit | Attending: Obstetrics and Gynecology | Admitting: Obstetrics and Gynecology

## 2013-12-26 DIAGNOSIS — R102 Pelvic and perineal pain: Secondary | ICD-10-CM

## 2013-12-26 DIAGNOSIS — O26892 Other specified pregnancy related conditions, second trimester: Secondary | ICD-10-CM

## 2013-12-26 DIAGNOSIS — R109 Unspecified abdominal pain: Secondary | ICD-10-CM | POA: Insufficient documentation

## 2013-12-26 DIAGNOSIS — O99891 Other specified diseases and conditions complicating pregnancy: Secondary | ICD-10-CM | POA: Insufficient documentation

## 2013-12-26 DIAGNOSIS — O9989 Other specified diseases and conditions complicating pregnancy, childbirth and the puerperium: Secondary | ICD-10-CM

## 2013-12-26 DIAGNOSIS — N949 Unspecified condition associated with female genital organs and menstrual cycle: Secondary | ICD-10-CM | POA: Insufficient documentation

## 2013-12-26 LAB — URINE MICROSCOPIC-ADD ON

## 2013-12-26 LAB — WET PREP, GENITAL
CLUE CELLS WET PREP: NONE SEEN
TRICH WET PREP: NONE SEEN
Yeast Wet Prep HPF POC: NONE SEEN

## 2013-12-26 LAB — URINALYSIS, ROUTINE W REFLEX MICROSCOPIC
BILIRUBIN URINE: NEGATIVE
Glucose, UA: NEGATIVE mg/dL
KETONES UR: NEGATIVE mg/dL
NITRITE: NEGATIVE
PH: 7 (ref 5.0–8.0)
Protein, ur: NEGATIVE mg/dL
SPECIFIC GRAVITY, URINE: 1.01 (ref 1.005–1.030)
UROBILINOGEN UA: 0.2 mg/dL (ref 0.0–1.0)

## 2013-12-26 NOTE — Discharge Instructions (Signed)
Third Trimester of Pregnancy °The third trimester is from week 29 through week 42, months 7 through 9. The third trimester is a time when the fetus is growing rapidly. At the end of the ninth month, the fetus is about 20 inches in length and weighs 6 10 pounds.  °BODY CHANGES °Your body goes through many changes during pregnancy. The changes vary from woman to woman.  °· Your weight will continue to increase. You can expect to gain 25 35 pounds (11 16 kg) by the end of the pregnancy. °· You may begin to get stretch marks on your hips, abdomen, and breasts. °· You may urinate more often because the fetus is moving lower into your pelvis and pressing on your bladder. °· You may develop or continue to have heartburn as a result of your pregnancy. °· You may develop constipation because certain hormones are causing the muscles that push waste through your intestines to slow down. °· You may develop hemorrhoids or swollen, bulging veins (varicose veins). °· You may have pelvic pain because of the weight gain and pregnancy hormones relaxing your joints between the bones in your pelvis. Back aches may result from over exertion of the muscles supporting your posture. °· Your breasts will continue to grow and be tender. A yellow discharge may leak from your breasts called colostrum. °· Your belly button may stick out. °· You may feel short of breath because of your expanding uterus. °· You may notice the fetus "dropping," or moving lower in your abdomen. °· You may have a bloody mucus discharge. This usually occurs a few days to a week before labor begins. °· Your cervix becomes thin and soft (effaced) near your due date. °WHAT TO EXPECT AT YOUR PRENATAL EXAMS  °You will have prenatal exams every 2 weeks until week 36. Then, you will have weekly prenatal exams. During a routine prenatal visit: °· You will be weighed to make sure you and the fetus are growing normally. °· Your blood pressure is taken. °· Your abdomen will be  measured to track your baby's growth. °· The fetal heartbeat will be listened to. °· Any test results from the previous visit will be discussed. °· You may have a cervical check near your due date to see if you have effaced. °At around 36 weeks, your caregiver will check your cervix. At the same time, your caregiver will also perform a test on the secretions of the vaginal tissue. This test is to determine if a type of bacteria, Group B streptococcus, is present. Your caregiver will explain this further. °Your caregiver may ask you: °· What your birth plan is. °· How you are feeling. °· If you are feeling the baby move. °· If you have had any abnormal symptoms, such as leaking fluid, bleeding, severe headaches, or abdominal cramping. °· If you have any questions. °Other tests or screenings that may be performed during your third trimester include: °· Blood tests that check for low iron levels (anemia). °· Fetal testing to check the health, activity level, and growth of the fetus. Testing is done if you have certain medical conditions or if there are problems during the pregnancy. °FALSE LABOR °You may feel small, irregular contractions that eventually go away. These are called Braxton Hicks contractions, or false labor. Contractions may last for hours, days, or even weeks before true labor sets in. If contractions come at regular intervals, intensify, or become painful, it is best to be seen by your caregiver.  °  SIGNS OF LABOR  °· Menstrual-like cramps. °· Contractions that are 5 minutes apart or less. °· Contractions that start on the top of the uterus and spread down to the lower abdomen and back. °· A sense of increased pelvic pressure or back pain. °· A watery or bloody mucus discharge that comes from the vagina. °If you have any of these signs before the 37th week of pregnancy, call your caregiver right away. You need to go to the hospital to get checked immediately. °HOME CARE INSTRUCTIONS  °· Avoid all  smoking, herbs, alcohol, and unprescribed drugs. These chemicals affect the formation and growth of the baby. °· Follow your caregiver's instructions regarding medicine use. There are medicines that are either safe or unsafe to take during pregnancy. °· Exercise only as directed by your caregiver. Experiencing uterine cramps is a good sign to stop exercising. °· Continue to eat regular, healthy meals. °· Wear a good support bra for breast tenderness. °· Do not use hot tubs, steam rooms, or saunas. °· Wear your seat belt at all times when driving. °· Avoid raw meat, uncooked cheese, cat litter boxes, and soil used by cats. These carry germs that can cause birth defects in the baby. °· Take your prenatal vitamins. °· Try taking a stool softener (if your caregiver approves) if you develop constipation. Eat more high-fiber foods, such as fresh vegetables or fruit and whole grains. Drink plenty of fluids to keep your urine clear or pale yellow. °· Take warm sitz baths to soothe any pain or discomfort caused by hemorrhoids. Use hemorrhoid cream if your caregiver approves. °· If you develop varicose veins, wear support hose. Elevate your feet for 15 minutes, 3 4 times a day. Limit salt in your diet. °· Avoid heavy lifting, wear low heal shoes, and practice good posture. °· Rest a lot with your legs elevated if you have leg cramps or low back pain. °· Visit your dentist if you have not gone during your pregnancy. Use a soft toothbrush to brush your teeth and be gentle when you floss. °· A sexual relationship may be continued unless your caregiver directs you otherwise. °· Do not travel far distances unless it is absolutely necessary and only with the approval of your caregiver. °· Take prenatal classes to understand, practice, and ask questions about the labor and delivery. °· Make a trial run to the hospital. °· Pack your hospital bag. °· Prepare the baby's nursery. °· Continue to go to all your prenatal visits as directed  by your caregiver. °SEEK MEDICAL CARE IF: °· You are unsure if you are in labor or if your water has broken. °· You have dizziness. °· You have mild pelvic cramps, pelvic pressure, or nagging pain in your abdominal area. °· You have persistent nausea, vomiting, or diarrhea. °· You have a bad smelling vaginal discharge. °· You have pain with urination. °SEEK IMMEDIATE MEDICAL CARE IF:  °· You have a fever. °· You are leaking fluid from your vagina. °· You have spotting or bleeding from your vagina. °· You have severe abdominal cramping or pain. °· You have rapid weight loss or gain. °· You have shortness of breath with chest pain. °· You notice sudden or extreme swelling of your face, hands, ankles, feet, or legs. °· You have not felt your baby move in over an hour. °· You have severe headaches that do not go away with medicine. °· You have vision changes. °Document Released: 08/02/2001 Document Revised: 04/10/2013 Document Reviewed:   You have severe abdominal cramping or pain.   You have rapid weight loss or gain.   You have shortness of breath with chest pain.   You notice sudden or extreme swelling of your face, hands, ankles, feet, or legs.   You have not felt your baby move in over an hour.   You have severe headaches that do not go away with medicine.   You have vision changes.  Document Released: 08/02/2001 Document Revised: 04/10/2013 Document Reviewed: 10/09/2012  [employer] Patient Information 2014 [employer].

## 2013-12-26 NOTE — MAU Provider Note (Signed)
History     CSN: [id number]  Arrival date and time: 12/26/13 2056   First Provider Initiated Contact with Patient 12/26/13 2130      Chief Complaint  Patient presents with  . Abdominal Cramping   Abdominal Cramping    Susan Andrade is a 33 y.o. G3P1011 at [redacted]w[redacted]d who presents today with lower abdominal pressure. She states that it feels like a menstrual cramp. Earlier today she had the pain, and it lasted for several hours, and was constant during that time. Then it stopped and has returned. She states that it usually lasts about 30 mins before residing. She denies any vaginal bleeding or LOF, and confirms fetal movement. She states that she has GDM with this pregnancy and is managing it with diet at this time.   Past Medical History  Diagnosis Date  . Sinusitis-bronchiectasis-situs inversus syndrome   . Polycystic ovary disease   . Leukocytoclastic vasculitis   . Hyperglycemia   . DM (diabetes mellitus) in pregnancy   . Retinal hemorrhage   . GBS (Guillain Barre syndrome)     Past Surgical History  Procedure Laterality Date  . Cesarean section      2010  . Dilation and evacuation N/A 10/10/2012    Procedure: DILATATION AND EVACUATION;  Surgeon: Cyril Mourning, MD;  Location: [facility];  Service: Gynecology;  Laterality: N/A;  . Dilation and evacuation N/A 10/13/2012    Procedure: DILATATION AND EVACUATION UNDER ULTRASOUND GUIDENCE;  Surgeon: Marylynn Pearson, MD;  Location: [facility];  Service: Gynecology;  Laterality: N/A;    Family History  Problem Relation Age of Onset  . Arthritis Mother   . Aneurysm Maternal Grandmother 80    Cerebral  . Coronary artery disease Maternal Grandfather 50  . Pancreatic cancer Father 10  . Melanoma Father     dx in early 52s    History  Substance Use Topics  . Smoking status: Never Smoker   . Smokeless tobacco: Never Used  . Alcohol Use: No    Allergies:  Allergies  Allergen Reactions  . Sulfonamide Derivatives Other (See  Comments)    Family history of rxn; pt does not want to take  . Amoxicillin Rash  . Latex Rash  . Penicillins Rash  . Sudafed [Pseudoephedrine] Palpitations    Prescriptions prior to admission  Medication Sig Dispense Refill  . metFORMIN (GLUCOPHAGE-XR) 750 MG 24 hr tablet Take 750 mg by mouth 2 (two) times daily.      Glory Rosebush DELICA LANCETS MISC Test blood sugars twice a day as needed  100 each  5  . Prenatal Vit-Fe Fumarate-FA (PRENATAL MULTIVITAMIN) TABS tablet Take 1 tablet by mouth at bedtime.         ROS Physical Exam   Blood pressure 138/83, pulse 118, temperature 98.6 F (37 C), temperature source Oral, resp. rate 18, height 5\' 5"  (1.651 m), weight 99.338 kg (219 lb), last menstrual period 06/05/2013, SpO2 100.00%.  Physical Exam  Nursing note and vitals reviewed. Constitutional: She is oriented to person, place, and time. She appears well-developed and well-nourished. No distress.  Cardiovascular: Normal rate.   Respiratory: Effort normal.  GI: Soft. There is no tenderness.  Genitourinary:   External: no lesion Vagina: small amount of white discharge Cervix: pink, smooth, 1 @ext  os/closed at int os/thick/high Uterus: AGA FHT: Cat for 25 weeks Toco: no UCs, no UC by palpation while in the room with the patient. Readjusted toco to see if anything picks up.  Neurological: She is alert and oriented to person, place, and time.  Skin: Skin is warm and dry.  Psychiatric: She has a normal mood and affect.    MAU Course  Procedures  Results for orders placed during the hospital encounter of 12/26/13 (from the past 24 hour(s))  URINALYSIS, ROUTINE W REFLEX MICROSCOPIC     Status: Abnormal   Collection Time    12/26/13  9:02 PM      Result Value Ref Range   Color, Urine YELLOW  YELLOW   APPearance CLEAR  CLEAR   Specific Gravity, Urine 1.010  1.005 - 1.030   pH 7.0  5.0 - 8.0   Glucose, UA NEGATIVE  NEGATIVE mg/dL   Hgb urine dipstick TRACE (*) NEGATIVE    Bilirubin Urine NEGATIVE  NEGATIVE   Ketones, ur NEGATIVE  NEGATIVE mg/dL   Protein, ur NEGATIVE  NEGATIVE mg/dL   Urobilinogen, UA 0.2  0.0 - 1.0 mg/dL   Nitrite NEGATIVE  NEGATIVE   Leukocytes, UA TRACE (*) NEGATIVE  URINE MICROSCOPIC-ADD ON     Status: Abnormal   Collection Time    12/26/13  9:02 PM      Result Value Ref Range   Squamous Epithelial / LPF FEW (*) RARE   WBC, UA 0-2  <3 WBC/hpf   RBC / HPF 0-2  <3 RBC/hpf   Bacteria, UA RARE  RARE  WET PREP, GENITAL     Status: Abnormal   Collection Time    12/26/13  9:35 PM      Result Value Ref Range   Yeast Wet Prep HPF POC NONE SEEN  NONE SEEN   Trich, Wet Prep NONE SEEN  NONE SEEN   Clue Cells Wet Prep HPF POC NONE SEEN  NONE SEEN   WBC, Wet Prep HPF POC MANY (*) NONE SEEN    2222: Send UC and increase PO hydration.  Assessment and Plan   1. Pregnancy related pelvic pain in second trimester, antepartum    PTL precautions Fetal kick counts Increase PO hydration UC pending    Medication List         metFORMIN 750 MG 24 hr tablet  Commonly known as:  GLUCOPHAGE-XR  Take 750 mg by mouth 2 (two) times daily.     ONETOUCH DELICA LANCETS Misc  Test blood sugars twice a day as needed     prenatal multivitamin Tabs tablet  Take 1 tablet by mouth at bedtime.       Follow-up Information   Follow up with Cyril Mourning, MD. (As scheduled)    Specialty:  Obstetrics and Gynecology   Contact information:   [place] [address] [place] 12/26/2013, 9:45 PM

## 2013-12-26 NOTE — MAU Note (Signed)
Pt reports lower abd cramping since this pm. Denies bleeding, denies dysuria.

## 2013-12-27 LAB — URINE CULTURE: Colony Count: 3000

## 2013-12-27 LAB — GC/CHLAMYDIA PROBE AMP
CT Probe RNA: NEGATIVE
GC PROBE AMP APTIMA: NEGATIVE

## 2014-01-27 ENCOUNTER — Other Ambulatory Visit: Payer: Self-pay

## 2014-02-11 ENCOUNTER — Telehealth: Payer: Self-pay | Admitting: Cardiology

## 2014-02-11 NOTE — Telephone Encounter (Signed)
The pt requests a call from Dr Hochrein's nurse as she states that Dr Percival Spanish wanted to see her again before she gives birth. Will forward note to [facility], Dr Hochrein's nurse.

## 2014-02-11 NOTE — Telephone Encounter (Signed)
New Message  Pt called for 3 month follow up.Susan Andrade next avail with Hochrein which is 03/26/2014 at this time. Pt states that her labor will be induced by that time. Offered PA at [employer] office. Pt declined, requests to see only Dr. Percival Spanish. Please assist.

## 2014-02-12 NOTE — Telephone Encounter (Signed)
Left message for pt to call back or I will attempt to contaact her again tomorrow about being scheduled.  Her f/u appt should have been made prior to her leaving the office at her last office visit.

## 2014-02-13 ENCOUNTER — Telehealth: Payer: Self-pay | Admitting: Cardiology

## 2014-02-13 NOTE — Telephone Encounter (Signed)
New message ° ° ° ° ° ° ° ° ° °Pt returning nurses call °

## 2014-02-13 NOTE — Telephone Encounter (Signed)
Pt scheduled for appt as per Dr Hochrein's last office visit.

## 2014-02-13 NOTE — Telephone Encounter (Signed)
New message ° ° ° ° ° ° °Pt returning nurse call  °

## 2014-02-13 NOTE — Telephone Encounter (Signed)
Left message for pt to call back to schedule f/u appt.

## 2014-02-26 ENCOUNTER — Ambulatory Visit (HOSPITAL_COMMUNITY)
Admission: RE | Admit: 2014-02-26 | Discharge: 2014-02-26 | Disposition: A | Discharge status: Home / Self Care | Payer: BC Managed Care – PPO | Source: Ambulatory Visit | Attending: Obstetrics and Gynecology | Admitting: Obstetrics and Gynecology

## 2014-02-26 ENCOUNTER — Encounter (HOSPITAL_COMMUNITY): Payer: Self-pay

## 2014-02-26 ENCOUNTER — Other Ambulatory Visit (HOSPITAL_COMMUNITY): Payer: Self-pay | Admitting: Obstetrics and Gynecology

## 2014-02-26 DIAGNOSIS — O289 Unspecified abnormal findings on antenatal screening of mother: Secondary | ICD-10-CM | POA: Insufficient documentation

## 2014-02-26 DIAGNOSIS — Z3689 Encounter for other specified antenatal screening: Secondary | ICD-10-CM

## 2014-02-26 DIAGNOSIS — O283 Abnormal ultrasonic finding on antenatal screening of mother: Secondary | ICD-10-CM

## 2014-03-02 ENCOUNTER — Inpatient Hospital Stay (HOSPITAL_COMMUNITY)
Admission: AD | Admit: 2014-03-02 | Discharge: 2014-03-02 | Disposition: A | Discharge status: Home / Self Care | Payer: BC Managed Care – PPO | Source: Ambulatory Visit | Attending: Obstetrics and Gynecology | Admitting: Obstetrics and Gynecology

## 2014-03-02 ENCOUNTER — Encounter (HOSPITAL_COMMUNITY): Payer: Self-pay | Admitting: *Deleted

## 2014-03-02 DIAGNOSIS — G610 Guillain-Barre syndrome: Secondary | ICD-10-CM | POA: Insufficient documentation

## 2014-03-02 DIAGNOSIS — Z3490 Encounter for supervision of normal pregnancy, unspecified, unspecified trimester: Secondary | ICD-10-CM

## 2014-03-02 DIAGNOSIS — O9989 Other specified diseases and conditions complicating pregnancy, childbirth and the puerperium: Principal | ICD-10-CM

## 2014-03-02 DIAGNOSIS — R42 Dizziness and giddiness: Secondary | ICD-10-CM | POA: Insufficient documentation

## 2014-03-02 DIAGNOSIS — E282 Polycystic ovarian syndrome: Secondary | ICD-10-CM | POA: Insufficient documentation

## 2014-03-02 DIAGNOSIS — O99891 Other specified diseases and conditions complicating pregnancy: Secondary | ICD-10-CM | POA: Insufficient documentation

## 2014-03-02 DIAGNOSIS — O99810 Abnormal glucose complicating pregnancy: Secondary | ICD-10-CM | POA: Insufficient documentation

## 2014-03-02 DIAGNOSIS — Q893 Situs inversus: Secondary | ICD-10-CM | POA: Insufficient documentation

## 2014-03-02 DIAGNOSIS — I498 Other specified cardiac arrhythmias: Secondary | ICD-10-CM | POA: Insufficient documentation

## 2014-03-02 DIAGNOSIS — J479 Bronchiectasis, uncomplicated: Secondary | ICD-10-CM | POA: Insufficient documentation

## 2014-03-02 LAB — URINALYSIS, ROUTINE W REFLEX MICROSCOPIC
Bilirubin Urine: NEGATIVE
Glucose, UA: NEGATIVE mg/dL
Hgb urine dipstick: NEGATIVE
Ketones, ur: NEGATIVE mg/dL
NITRITE: NEGATIVE
PROTEIN: NEGATIVE mg/dL
Specific Gravity, Urine: 1.01 (ref 1.005–1.030)
Urobilinogen, UA: 0.2 mg/dL (ref 0.0–1.0)
pH: 6.5 (ref 5.0–8.0)

## 2014-03-02 LAB — URINE MICROSCOPIC-ADD ON

## 2014-03-02 MED ORDER — HYDROXYZINE HCL 25 MG PO TABS
25.0000 mg | ORAL_TABLET | Freq: Once | ORAL | Status: DC
  Filled 2014-03-02: qty 1

## 2014-03-02 NOTE — MAU Note (Signed)
Pt reports dizziness since last night.

## 2014-03-02 NOTE — MAU Provider Note (Signed)
History     CSN: [id number]  Arrival date and time: 03/02/14 1214   First Provider Initiated Contact with Patient 03/02/14 1320      Chief Complaint  Patient presents with  . Dizziness   HPI Comments: Susan Andrade 33 y.o. [id number] [redacted]w[redacted]d presents to MAU with vertigo that started last night with a movement change. Since then she has felt lightheaded and once felt like she might faint. She has a condition called situs inversus and sees Susan Andrade for her sinus tachycardia. She does not feel palpations. She has Gestational diabetes.      Past Medical History  Diagnosis Date  . Sinusitis-bronchiectasis-situs inversus syndrome   . Polycystic ovary disease   . Leukocytoclastic vasculitis   . Hyperglycemia   . DM (diabetes mellitus) in pregnancy   . Retinal hemorrhage   . GBS (Guillain Barre syndrome)     Past Surgical History  Procedure Laterality Date  . Cesarean section      2010  . Dilation and evacuation N/A 10/10/2012    Procedure: DILATATION AND EVACUATION;  Surgeon: Susan Mourning, MD;  Location: [facility];  Service: Gynecology;  Laterality: N/A;  . Dilation and evacuation N/A 10/13/2012    Procedure: DILATATION AND EVACUATION UNDER ULTRASOUND GUIDENCE;  Surgeon: Susan Pearson, MD;  Location: [facility];  Service: Gynecology;  Laterality: N/A;    Family History  Problem Relation Age of Onset  . Arthritis Mother   . Aneurysm Maternal Grandmother 80    Cerebral  . Coronary artery disease Maternal Grandfather 50  . Pancreatic cancer Father 16  . Melanoma Father     dx in early 39s    History  Substance Use Topics  . Smoking status: Never Smoker   . Smokeless tobacco: Never Used  . Alcohol Use: No    Allergies:  Allergies  Allergen Reactions  . Sulfonamide Derivatives Other (See Comments)    Family history of rxn; pt does not want to take  . Amoxicillin Rash  . Latex Rash  . Penicillins Rash  . Sudafed [Pseudoephedrine] Palpitations    Prescriptions  prior to admission  Medication Sig Dispense Refill  . metFORMIN (GLUCOPHAGE-XR) 750 MG 24 hr tablet Take 750 mg by mouth 2 (two) times daily.      Glory Rosebush DELICA LANCETS MISC Test blood sugars twice a day as needed  100 each  5  . Prenatal Vit-Fe Fumarate-FA (PRENATAL MULTIVITAMIN) TABS tablet Take 1 tablet by mouth at bedtime.         Review of Systems  Constitutional: Negative.   HENT: Negative.   Eyes: Negative.   Respiratory: Negative.   Cardiovascular: Negative.   Gastrointestinal: Negative.   Genitourinary: Negative.   Musculoskeletal: Negative.   Skin: Negative.   Neurological: Positive for dizziness.  Psychiatric/Behavioral: Negative.    Physical Exam   Blood pressure 107/63, pulse 124, temperature 98.3 F (36.8 C), temperature source Oral, resp. rate 18, height 5\' 5"  (1.651 m), weight 99.791 kg (220 lb).  Physical Exam  Constitutional: She is oriented to person, place, and time. She appears well-developed and well-nourished. No distress.  HENT:  Head: Normocephalic and atraumatic.  Eyes: Pupils are equal, round, and reactive to light.  Cardiovascular:  tachycardia  Respiratory: Effort normal and breath sounds normal. No respiratory distress.  GI: Soft. Bowel sounds are normal. She exhibits no distension. There is tenderness. There is guarding. There is no rebound.  Genitourinary:  Not done  Musculoskeletal: Normal range of motion.  Neurological: She is alert and oriented to person, place, and time.  Skin: Skin is warm.  Psychiatric: She has a normal mood and affect. Her behavior is normal. Judgment and thought content normal.   TOCO:  FHR 150 Variablilty moderate Accels yes Decels no Contractions: none  MAU Course  Procedures  MDM  Spoke with Susan Andrade who advised Vistaril, eating and fluids Pt refused the Vistaril and would like EKG done EKG done and overread by Susan Andrade, Cardiology Reviewed case with Susan Andrade who felt ok with sending patient  home  Assessment and Plan   A: Vertigo  P: Advised to rest/ fluids Note for work tomorrow Follow up with Susan Andrade/ Cardiology Return to MAU as needed  Georgia Duff 03/02/2014, 1:36 PM

## 2014-03-02 NOTE — MAU Note (Signed)
Blood sugar 85 fasting. 146 one hour after eating.

## 2014-03-02 NOTE — MAU Note (Signed)
Started feeling dizzy yesterday. Felt like was going to pass out. Would get dizzy with any movement.  Checked BP, diastolic was in the 01'I - low for her.  Checked BS- was ok.. Felt dizzy again in the shower, called and was told to come in

## 2014-03-02 NOTE — Discharge Instructions (Signed)
Benign Positional Vertigo Vertigo means you feel like you or your surroundings are moving when they are not. Benign positional vertigo is the most common form of vertigo. Benign means that the cause of your condition is not serious. Benign positional vertigo is more common in older adults. CAUSES  Benign positional vertigo is the result of an upset in the labyrinth system. This is an area in the middle ear that helps control your balance. This may be caused by a viral infection, head injury, or repetitive motion. However, often no specific cause is found. SYMPTOMS  Symptoms of benign positional vertigo occur when you move your head or eyes in different directions. Some of the symptoms may include:  Loss of balance and falls.  Vomiting.  Blurred vision.  Dizziness.  Nausea.  Involuntary eye movements (nystagmus). DIAGNOSIS  Benign positional vertigo is usually diagnosed by physical exam. If the specific cause of your benign positional vertigo is unknown, your caregiver may perform imaging tests, such as magnetic resonance imaging (MRI) or computed tomography (CT). TREATMENT  Your caregiver may recommend movements or procedures to correct the benign positional vertigo. Medicines such as meclizine, benzodiazepines, and medicines for nausea may be used to treat your symptoms. In rare cases, if your symptoms are caused by certain conditions that affect the inner ear, you may need surgery. HOME CARE INSTRUCTIONS   Follow your caregiver's instructions.  Move slowly. Do not make sudden body or head movements.  Avoid driving.  Avoid operating heavy machinery.  Avoid performing any tasks that would be dangerous to you or others during a vertigo episode.  Drink enough fluids to keep your urine clear or pale yellow. SEEK IMMEDIATE MEDICAL CARE IF:   You develop problems with walking, weakness, numbness, or using your arms, hands, or legs.  You have difficulty speaking.  You develop  severe headaches.  Your nausea or vomiting continues or gets worse.  You develop visual changes.  Your family or friends notice any behavioral changes.  Your condition gets worse.  You have a fever.  You develop a stiff neck or sensitivity to light. MAKE SURE YOU:   Understand these instructions.  Will watch your condition.  Will get help right away if you are not doing well or get worse. Document Released: 05/16/2006 Document Revised: 10/31/2011 Document Reviewed: 04/28/2011 [employer] Patient Information 2015 [employer]. This information is not intended to replace advice given to you by your health care provider. Make sure you discuss any questions you have with your health care provider.    

## 2014-03-05 ENCOUNTER — Other Ambulatory Visit (HOSPITAL_COMMUNITY): Payer: BC Managed Care – PPO

## 2014-03-07 ENCOUNTER — Encounter: Payer: Self-pay | Admitting: Cardiology

## 2014-03-07 ENCOUNTER — Ambulatory Visit (INDEPENDENT_AMBULATORY_CARE_PROVIDER_SITE_OTHER): Payer: BC Managed Care – PPO | Admitting: Cardiology

## 2014-03-07 VITALS — BP 113/75 | HR 117 | Ht 65.0 in | Wt 218.0 lb

## 2014-03-07 DIAGNOSIS — Q893 Situs inversus: Secondary | ICD-10-CM

## 2014-03-07 NOTE — Progress Notes (Signed)
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  She saw me in 2011 during a previous pregnancy. She had an unremarkable pregnancy although she did require a C-section for noncardiac reasons. She did have gestational diabetes and now has hyperglycemia.   She is being considered for an elective C-section on August 12 might deliver vaginally prior to this. She did go to the emergency room and was observed briefly there and I reviewed these records. She was felt to probably be dehydrated. She was doing a little lightheaded when she would bend over and describes some vertigo-like symptoms. She wasn't having any presyncope or syncope. She's not had any chest pain. She's had a little dyspnea with exertion but this has been mild and probably consistent with her weight gain and the fact that it's been very hot outside. This is not particularly different than previous. She's not having any chest pressure, neck or arm discomfort. She's not feeling any heart racing.  She denies any PND or orthopnea.   Allergies  Allergen Reactions  . Sulfonamide Derivatives Other (See Comments)    Family history of rxn; pt does not want to take  . Amoxicillin Rash  . Latex Rash  . Penicillins Rash  . Sudafed [Pseudoephedrine] Palpitations    Current Outpatient Prescriptions  Medication Sig Dispense Refill  . metFORMIN (GLUCOPHAGE-XR) 750 MG 24 hr tablet Take 750 mg by mouth 2 (two) times daily.      Glory Rosebush DELICA LANCETS MISC Test blood sugars twice a day as needed  100 each  5  . Prenatal Vit-Fe Fumarate-FA (PRENATAL MULTIVITAMIN) TABS tablet Take 1 tablet by mouth at bedtime.        No current facility-administered medications for this visit.    Past Medical History  Diagnosis Date  . Sinusitis-bronchiectasis-situs inversus syndrome   . Polycystic ovary disease   . Leukocytoclastic vasculitis   . Hyperglycemia   . DM (diabetes mellitus) in pregnancy   .  Retinal hemorrhage   . GBS (Guillain Barre syndrome)     Past Surgical History  Procedure Laterality Date  . Cesarean section      2010  . Dilation and evacuation N/A 10/10/2012    Procedure: DILATATION AND EVACUATION;  Surgeon: Cyril Mourning, MD;  Location: [facility];  Service: Gynecology;  Laterality: N/A;  . Dilation and evacuation N/A 10/13/2012    Procedure: DILATATION AND EVACUATION UNDER ULTRASOUND GUIDENCE;  Surgeon: Marylynn Pearson, MD;  Location: [facility];  Service: Gynecology;  Laterality: N/A;    ROS:   As stated in the HPI and negative for all other systems.  PHYSICAL EXAM BP 113/75  Pulse 117  Ht 5\' 5"  (1.651 m)  Wt 218 lb (98.884 kg)  BMI 36.28 kg/m2 GENERAL:  Well appearing HEENT:  Pupils equal round and reactive, fundi not visualized, oral mucosa unremarkable NECK:  No jugular venous distention, waveform within normal limits, carotid upstroke brisk and symmetric, no bruits, no thyromegaly LYMPHATICS:  No cervical, inguinal adenopathy LUNGS:  Clear to auscultation bilaterally BACK:  No CVA tenderness CHEST:  Unremarkable HEART:  PMI is on the right side ,S1 and S2 within normal limits, no S3, no S4, no clicks, no rubs, soft apical brief early peaking systolic murmur radiating slightly for the right upper sternal border, no diastolicmurmurs ABD:  Flat, positive bowel sounds normal in frequency in pitch, no bruits, no rebound, no guarding, no midline pulsatile mass, no hepatomegaly, no splenomegaly, gravid  EXT:  2 plus pulses throughout, trace bilateral lower edema, no cyanosis no clubbing SKIN:  No rashes no nodules NEURO:  Cranial nerves II through XII grossly intact, motor grossly intact throughout PSYCH:  Cognitively intact, oriented to person place and time  EKG:   (Right sided leads)  Sinus tachycardia, rate 117, left axis deviation, poor anterior R wave progression, no acute ST-T wave changes, no change from previous.  03/07/2014  ASSESSMENT AND PLAN  SITUS  INVERSUS:    The patient has had no symptoms related to this and no associated congenital abnormalities. She did have a murmur this time that is consistent with a flow murmur.  She has no further dizziness. No further testing is indicated.    TACHYCARDIA:  The has a normal response with vagal maneuvers and normal nighttime heart rate variation. She has had her labs followed by her OB/GYN. No change in management of further testing is indicated.  ED records reviewed.

## 2014-03-07 NOTE — Patient Instructions (Signed)
Your physician recommends that you schedule a follow-up appointment in: 4 months with Dr. Hochrein  

## 2014-03-17 ENCOUNTER — Ambulatory Visit: Payer: BC Managed Care – PPO | Admitting: Cardiology

## 2014-03-24 ENCOUNTER — Encounter (HOSPITAL_COMMUNITY): Payer: Self-pay | Admitting: Pharmacist

## 2014-03-25 ENCOUNTER — Encounter (HOSPITAL_COMMUNITY): Payer: BC Managed Care – PPO | Admitting: Anesthesiology

## 2014-03-25 ENCOUNTER — Inpatient Hospital Stay (HOSPITAL_COMMUNITY)
Admission: AD | Admit: 2014-03-25 | Discharge: 2014-03-27 | DRG: 775 | Disposition: A | Discharge status: Home / Self Care | Payer: BC Managed Care – PPO | Source: Ambulatory Visit | Attending: Obstetrics and Gynecology | Admitting: Obstetrics and Gynecology

## 2014-03-25 ENCOUNTER — Encounter (HOSPITAL_COMMUNITY): Payer: Self-pay | Admitting: *Deleted

## 2014-03-25 ENCOUNTER — Inpatient Hospital Stay (HOSPITAL_COMMUNITY): Payer: BC Managed Care – PPO | Admitting: Anesthesiology

## 2014-03-25 DIAGNOSIS — O878 Other venous complications in the puerperium: Secondary | ICD-10-CM | POA: Diagnosis present

## 2014-03-25 DIAGNOSIS — Z808 Family history of malignant neoplasm of other organs or systems: Secondary | ICD-10-CM

## 2014-03-25 DIAGNOSIS — O139 Gestational [pregnancy-induced] hypertension without significant proteinuria, unspecified trimester: Secondary | ICD-10-CM | POA: Diagnosis present

## 2014-03-25 DIAGNOSIS — O99814 Abnormal glucose complicating childbirth: Principal | ICD-10-CM | POA: Diagnosis present

## 2014-03-25 DIAGNOSIS — Z8249 Family history of ischemic heart disease and other diseases of the circulatory system: Secondary | ICD-10-CM

## 2014-03-25 DIAGNOSIS — K649 Unspecified hemorrhoids: Secondary | ICD-10-CM | POA: Diagnosis present

## 2014-03-25 DIAGNOSIS — Z800 Family history of malignant neoplasm of digestive organs: Secondary | ICD-10-CM

## 2014-03-25 DIAGNOSIS — E282 Polycystic ovarian syndrome: Secondary | ICD-10-CM | POA: Diagnosis present

## 2014-03-25 DIAGNOSIS — O34219 Maternal care for unspecified type scar from previous cesarean delivery: Secondary | ICD-10-CM | POA: Diagnosis present

## 2014-03-25 DIAGNOSIS — Z3490 Encounter for supervision of normal pregnancy, unspecified, unspecified trimester: Secondary | ICD-10-CM

## 2014-03-25 HISTORY — DX: Situs inversus: Q89.3

## 2014-03-25 LAB — CBC
HCT: 36.5 % (ref 36.0–46.0)
HEMOGLOBIN: 12.4 g/dL (ref 12.0–15.0)
MCH: 27.8 pg (ref 26.0–34.0)
MCHC: 34 g/dL (ref 30.0–36.0)
MCV: 81.8 fL (ref 78.0–100.0)
Platelets: 237 10*3/uL (ref 150–400)
RBC: 4.46 MIL/uL (ref 3.87–5.11)
RDW: 15 % (ref 11.5–15.5)
WBC: 11.9 10*3/uL — ABNORMAL HIGH (ref 4.0–10.5)

## 2014-03-25 LAB — OB RESULTS CONSOLE RPR: RPR: NONREACTIVE

## 2014-03-25 LAB — OB RESULTS CONSOLE HIV ANTIBODY (ROUTINE TESTING): HIV: NONREACTIVE

## 2014-03-25 LAB — LACTATE DEHYDROGENASE: LDH: 132 U/L (ref 94–250)

## 2014-03-25 LAB — URINALYSIS, ROUTINE W REFLEX MICROSCOPIC
Bilirubin Urine: NEGATIVE
GLUCOSE, UA: NEGATIVE mg/dL
Ketones, ur: 15 mg/dL — AB
Nitrite: NEGATIVE
PH: 6.5 (ref 5.0–8.0)
Protein, ur: NEGATIVE mg/dL
Specific Gravity, Urine: <1.005 — ABNORMAL LOW (ref 1.005–1.030)
Urobilinogen, UA: 0.2 mg/dL (ref 0.0–1.0)

## 2014-03-25 LAB — COMPREHENSIVE METABOLIC PANEL
ALK PHOS: 86 U/L (ref 39–117)
ALT: 11 U/L (ref 0–35)
ANION GAP: 14 (ref 5–15)
AST: 15 U/L (ref 0–37)
Albumin: 3.1 g/dL — ABNORMAL LOW (ref 3.5–5.2)
BUN: 4 mg/dL — AB (ref 6–23)
CALCIUM: 9.1 mg/dL (ref 8.4–10.5)
CO2: 22 meq/L (ref 19–32)
Chloride: 101 mEq/L (ref 96–112)
Creatinine, Ser: 0.38 mg/dL — ABNORMAL LOW (ref 0.50–1.10)
GLUCOSE: 83 mg/dL (ref 70–99)
Potassium: 4.1 mEq/L (ref 3.7–5.3)
SODIUM: 137 meq/L (ref 137–147)
Total Bilirubin: 0.3 mg/dL (ref 0.3–1.2)
Total Protein: 6.4 g/dL (ref 6.0–8.3)

## 2014-03-25 LAB — TYPE AND SCREEN
ABO/RH(D): O POS
Antibody Screen: NEGATIVE
UNIT DIVISION: 0

## 2014-03-25 LAB — OB RESULTS CONSOLE GC/CHLAMYDIA
Chlamydia: NEGATIVE
Gonorrhea: NEGATIVE

## 2014-03-25 LAB — OB RESULTS CONSOLE GBS: GBS: NEGATIVE

## 2014-03-25 LAB — URINE MICROSCOPIC-ADD ON

## 2014-03-25 LAB — OB RESULTS CONSOLE HEPATITIS B SURFACE ANTIGEN: HEP B S AG: NEGATIVE

## 2014-03-25 LAB — OB RESULTS CONSOLE RUBELLA ANTIBODY, IGM: RUBELLA: NON-IMMUNE/NOT IMMUNE

## 2014-03-25 LAB — RPR

## 2014-03-25 MED ORDER — LIDOCAINE HCL (PF) 1 % IJ SOLN
INTRAMUSCULAR | Status: DC | PRN
  Administered 2014-03-25 (×2): 8 mL

## 2014-03-25 MED ORDER — DIPHENHYDRAMINE HCL 50 MG/ML IJ SOLN: 12.5000 mg | INTRAMUSCULAR | Status: DC | PRN

## 2014-03-25 MED ORDER — FLEET ENEMA 7-19 GM/118ML RE ENEM: 1.0000 | ENEMA | RECTAL | Status: DC | PRN

## 2014-03-25 MED ORDER — FENTANYL 2.5 MCG/ML BUPIVACAINE 1/10 % EPIDURAL INFUSION (WH - ANES)
14.0000 mL/h | INTRAMUSCULAR | Status: DC | PRN
  Filled 2014-03-25: qty 125

## 2014-03-25 MED ORDER — LIDOCAINE HCL (PF) 1 % IJ SOLN
30.0000 mL | INTRAMUSCULAR | Status: DC | PRN
  Filled 2014-03-25: qty 30

## 2014-03-25 MED ORDER — LACTATED RINGERS IV SOLN
500.0000 mL | INTRAVENOUS | Status: DC | PRN
Start: 2014-03-25 — End: 2014-03-25

## 2014-03-25 MED ORDER — TETANUS-DIPHTH-ACELL PERTUSSIS 5-2.5-18.5 LF-MCG/0.5 IM SUSP: 0.5000 mL | Freq: Once | INTRAMUSCULAR | Status: DC

## 2014-03-25 MED ORDER — DIPHENHYDRAMINE HCL 25 MG PO CAPS: 25.0000 mg | ORAL_CAPSULE | Freq: Four times a day (QID) | ORAL | Status: DC | PRN

## 2014-03-25 MED ORDER — MEDROXYPROGESTERONE ACETATE 150 MG/ML IM SUSP: 150.0000 mg | INTRAMUSCULAR | Status: DC | PRN

## 2014-03-25 MED ORDER — FLEET ENEMA 7-19 GM/118ML RE ENEM: 1.0000 | ENEMA | Freq: Every day | RECTAL | Status: DC | PRN

## 2014-03-25 MED ORDER — DIBUCAINE 1 % RE OINT
1.0000 "application " | TOPICAL_OINTMENT | RECTAL | Status: DC | PRN
  Administered 2014-03-26: 1 via RECTAL
  Filled 2014-03-25: qty 28

## 2014-03-25 MED ORDER — ONDANSETRON HCL 4 MG/2ML IJ SOLN: 4.0000 mg | INTRAMUSCULAR | Status: DC | PRN

## 2014-03-25 MED ORDER — ZOLPIDEM TARTRATE 5 MG PO TABS: 5.0000 mg | ORAL_TABLET | Freq: Every evening | ORAL | Status: DC | PRN

## 2014-03-25 MED ORDER — OXYCODONE-ACETAMINOPHEN 5-325 MG PO TABS: 1.0000 | ORAL_TABLET | ORAL | Status: DC | PRN

## 2014-03-25 MED ORDER — IBUPROFEN 600 MG PO TABS
600.0000 mg | ORAL_TABLET | Freq: Four times a day (QID) | ORAL | Status: DC
  Administered 2014-03-26 – 2014-03-27 (×6): 600 mg via ORAL
  Filled 2014-03-25 (×6): qty 1

## 2014-03-25 MED ORDER — OXYTOCIN BOLUS FROM INFUSION
500.0000 mL | INTRAVENOUS | Status: DC
  Administered 2014-03-25: 500 mL via INTRAVENOUS

## 2014-03-25 MED ORDER — MEASLES, MUMPS & RUBELLA VAC ~~LOC~~ INJ
0.5000 mL | INJECTION | Freq: Once | SUBCUTANEOUS | Status: DC
  Filled 2014-03-25 (×2): qty 0.5

## 2014-03-25 MED ORDER — SENNOSIDES-DOCUSATE SODIUM 8.6-50 MG PO TABS
2.0000 | ORAL_TABLET | ORAL | Status: DC
  Administered 2014-03-26 (×2): 2 via ORAL
  Filled 2014-03-25 (×2): qty 2

## 2014-03-25 MED ORDER — BENZOCAINE-MENTHOL 20-0.5 % EX AERO
1.0000 "application " | INHALATION_SPRAY | CUTANEOUS | Status: DC | PRN
  Administered 2014-03-26: 1 via TOPICAL
  Filled 2014-03-25 (×2): qty 56

## 2014-03-25 MED ORDER — WITCH HAZEL-GLYCERIN EX PADS: 1.0000 "application " | MEDICATED_PAD | CUTANEOUS | Status: DC | PRN

## 2014-03-25 MED ORDER — LANOLIN HYDROUS EX OINT: TOPICAL_OINTMENT | CUTANEOUS | Status: DC | PRN

## 2014-03-25 MED ORDER — ONDANSETRON HCL 4 MG/2ML IJ SOLN: 4.0000 mg | Freq: Four times a day (QID) | INTRAMUSCULAR | Status: DC | PRN

## 2014-03-25 MED ORDER — FENTANYL 2.5 MCG/ML BUPIVACAINE 1/10 % EPIDURAL INFUSION (WH - ANES)
INTRAMUSCULAR | Status: DC | PRN
  Administered 2014-03-25: 14 mL/h via EPIDURAL

## 2014-03-25 MED ORDER — BISACODYL 10 MG RE SUPP: 10.0000 mg | Freq: Every day | RECTAL | Status: DC | PRN

## 2014-03-25 MED ORDER — LACTATED RINGERS IV SOLN: 500.0000 mL | Freq: Once | INTRAVENOUS | Status: DC

## 2014-03-25 MED ORDER — IBUPROFEN 600 MG PO TABS
600.0000 mg | ORAL_TABLET | Freq: Four times a day (QID) | ORAL | Status: DC | PRN
  Filled 2014-03-25: qty 1

## 2014-03-25 MED ORDER — CITRIC ACID-SODIUM CITRATE 334-500 MG/5ML PO SOLN: 30.0000 mL | ORAL | Status: DC | PRN

## 2014-03-25 MED ORDER — ACETAMINOPHEN 325 MG PO TABS: 650.0000 mg | ORAL_TABLET | ORAL | Status: DC | PRN

## 2014-03-25 MED ORDER — OXYTOCIN 40 UNITS IN LACTATED RINGERS INFUSION - SIMPLE MED
62.5000 mL/h | INTRAVENOUS | Status: DC
Start: 2014-03-25 — End: 2014-03-25

## 2014-03-25 MED ORDER — OXYTOCIN 40 UNITS IN LACTATED RINGERS INFUSION - SIMPLE MED
1.0000 m[IU]/min | INTRAVENOUS | Status: DC
  Administered 2014-03-25: 2 m[IU]/min via INTRAVENOUS
  Filled 2014-03-25: qty 1000

## 2014-03-25 MED ORDER — PRENATAL MULTIVITAMIN CH
1.0000 | ORAL_TABLET | Freq: Every day | ORAL | Status: DC
  Administered 2014-03-26: 1 via ORAL
  Filled 2014-03-25 (×2): qty 1

## 2014-03-25 MED ORDER — LACTATED RINGERS IV SOLN
INTRAVENOUS | Status: DC
  Administered 2014-03-25: 14:00:00 via INTRAVENOUS

## 2014-03-25 MED ORDER — ONDANSETRON HCL 4 MG PO TABS: 4.0000 mg | ORAL_TABLET | ORAL | Status: DC | PRN

## 2014-03-25 MED ORDER — SIMETHICONE 80 MG PO CHEW: 80.0000 mg | CHEWABLE_TABLET | ORAL | Status: DC | PRN

## 2014-03-25 NOTE — Anesthesia Procedure Notes (Signed)
Epidural Patient location during procedure: OB Start time: 03/25/2014 4:25 PM End time: 03/25/2014 4:29 PM  Staffing Anesthesiologist: Lyn Hollingshead Performed by: anesthesiologist   Preanesthetic Checklist Completed: patient identified, surgical consent, pre-op evaluation, timeout performed, IV checked, risks and benefits discussed and monitors and equipment checked  Epidural Patient position: sitting Prep: site prepped and draped and DuraPrep Patient monitoring: continuous pulse ox and blood pressure Approach: midline Location: L3-L4 Injection technique: LOR air  Needle:  Needle type: Tuohy  Needle gauge: 17 G Needle length: 9 cm and 9 Needle insertion depth: 6 cm Catheter type: closed end flexible Catheter size: 19 Gauge Catheter at skin depth: 11 cm Test dose: negative and Other  Assessment Sensory level: T9 Events: blood not aspirated, injection not painful, no injection resistance, negative IV test and no paresthesia  Additional Notes Reason for block:procedure for pain

## 2014-03-25 NOTE — MAU Provider Note (Signed)
History     CSN: [id number]  Arrival date and time: 03/25/14 1110   First Provider Initiated Contact with Patient 03/25/14 1254      Chief Complaint  Patient presents with  . Hypertension   Hypertension Pertinent negatives include no blurred vision or headaches.    33 yo G3P1011 at [redacted]w[redacted]d wks IUP sent from office due to elevated blood pressure.  Blood pressure in office was 140's/80's.  Denies headache, epigastric pain, or vision changes.    Past Medical History  Diagnosis Date  . Sinusitis-bronchiectasis-situs inversus syndrome   . Polycystic ovary disease   . Leukocytoclastic vasculitis   . Hyperglycemia   . DM (diabetes mellitus) in pregnancy   . Retinal hemorrhage   . GBS (Guillain Barre syndrome)   . Pregnancy induced hypertension     Past Surgical History  Procedure Laterality Date  . Cesarean section      2010  . Dilation and evacuation N/A 10/10/2012    Procedure: DILATATION AND EVACUATION;  Surgeon: Cyril Mourning, MD;  Location: [facility];  Service: Gynecology;  Laterality: N/A;  . Dilation and evacuation N/A 10/13/2012    Procedure: DILATATION AND EVACUATION UNDER ULTRASOUND GUIDENCE;  Surgeon: Marylynn Pearson, MD;  Location: [facility];  Service: Gynecology;  Laterality: N/A;    Family History  Problem Relation Age of Onset  . Arthritis Mother   . Aneurysm Maternal Grandmother 80    Cerebral  . Coronary artery disease Maternal Grandfather 50  . Pancreatic cancer Father 41  . Melanoma Father     dx in early 38s    History  Substance Use Topics  . Smoking status: Never Smoker   . Smokeless tobacco: Never Used  . Alcohol Use: No    Allergies:  Allergies  Allergen Reactions  . Sulfonamide Derivatives Other (See Comments)    Family history of rxn; pt does not want to take  . Amoxicillin Hives and Rash  . Latex Rash  . Penicillins Hives and Rash  . Sudafed [Pseudoephedrine] Palpitations    Prescriptions prior to admission  Medication Sig Dispense  Refill  . metFORMIN (GLUCOPHAGE-XR) 750 MG 24 hr tablet Take 750 mg by mouth 2 (two) times daily.      Glory Rosebush DELICA LANCETS MISC Test blood sugars twice a day as needed  100 each  5  . Prenatal Vit-Fe Fumarate-FA (PRENATAL MULTIVITAMIN) TABS tablet Take 1 tablet by mouth at bedtime.         Review of Systems  Eyes: Negative for blurred vision and double vision.  Gastrointestinal: Negative for abdominal pain.  Genitourinary:       Vaginal discharge  Neurological: Negative for headaches.  All other systems reviewed and are negative.  Physical Exam   Filed Vitals:   03/25/14 1214 03/25/14 1217 03/25/14 1232 03/25/14 1251  BP: 133/88 124/82 136/93 121/82  Pulse: 123 119 117 112  Temp:      TempSrc:      Resp:      Height:      Weight:      SpO2:        Blood pressure 136/93, pulse 117, temperature 99 F (37.2 C), temperature source Oral, resp. rate 18, height 5\' 5"  (1.651 m), weight 99.066 kg (218 lb 6.4 oz), SpO2 100.00%.  Physical Exam  Constitutional: She is oriented to person, place, and time. She appears well-developed and well-nourished.  HENT:  Head: Normocephalic.  Neck: Normal range of motion. Neck supple.  Cardiovascular: Normal rate,  regular rhythm and normal heart sounds.   Respiratory: Effort normal and breath sounds normal.  Genitourinary: No bleeding around the vagina.  Musculoskeletal: Normal range of motion. She exhibits edema (1+ bilat pedal edema).  Neurological: She is alert and oriented to person, place, and time.  Skin: Skin is warm and dry.    MAU Course  Procedures  Consulted with Dr. Julien Girt > pt is to be an admit to [facility] (holding for bed)  Assessment and Plan  33 yo G3P1011 at [redacted]w[redacted]d weeks IUP Gestational Hypertension Category I FHR Tracing Hx of Previous Csection  Plan: Admit to [facility] for Induction of Labor   Gwen Pounds 03/25/2014, 12:55 PM

## 2014-03-25 NOTE — MAU Note (Signed)
IV start attempt by Staff RN; Per patient request CRNA called for IV Start.

## 2014-03-25 NOTE — MAU Note (Signed)
CRNA at bedside for iv start

## 2014-03-25 NOTE — H&P (Signed)
Susan Andrade is a 33 y.o. G 3 P 1011 at 38 weeks presents from the office with contractions and increased bloody discharge this am. Cervix yesterday 2 to 3 cm and changed to 3.5 cm. She wants a TOLAC.  EFW yesterday 7 pound 13 oz. GDM fair controlled.  BP is also elevated in office and proteinuria Maternal Medical History:  Reason for admission: Contractions.   Contractions: Perceived severity is mild.    Fetal activity: Perceived fetal activity is normal.      OB History   Grav Para Term Preterm Abortions TAB SAB Ect Mult Living   3 1 1  0 1 0 1 0 0 1     Past Medical History  Diagnosis Date  . Sinusitis-bronchiectasis-situs inversus syndrome   . Polycystic ovary disease   . Leukocytoclastic vasculitis   . Hyperglycemia   . DM (diabetes mellitus) in pregnancy   . Retinal hemorrhage   . GBS (Guillain Barre syndrome)   . Pregnancy induced hypertension    Past Surgical History  Procedure Laterality Date  . Cesarean section      2010  . Dilation and evacuation N/A 10/10/2012    Procedure: DILATATION AND EVACUATION;  Surgeon: Cyril Mourning, MD;  Location: [facility];  Service: Gynecology;  Laterality: N/A;  . Dilation and evacuation N/A 10/13/2012    Procedure: DILATATION AND EVACUATION UNDER ULTRASOUND GUIDENCE;  Surgeon: Marylynn Pearson, MD;  Location: [facility];  Service: Gynecology;  Laterality: N/A;   Family History: family history includes Aneurysm (age of onset: 48) in her maternal grandmother; Arthritis in her mother; Coronary artery disease (age of onset: 31) in her maternal grandfather; Melanoma in her father; Pancreatic cancer (age of onset: 30) in her father. Social History:  reports that she has never smoked. She has never used smokeless tobacco. She reports that she does not drink alcohol or use illicit drugs.   Prenatal Transfer Tool  Maternal Diabetes: Yes:  Diabetes Type:  Diet controlled Genetic Screening: Normal Maternal Ultrasounds/Referrals: Enlarged kidney  evaluated by MFM please see their ultrasound report Fetal Ultrasounds or other Referrals:  None Maternal Substance Abuse:  No Significant Maternal Medications:  None Significant Maternal Lab Results:  None Other Comments:  None  Review of Systems  All other systems reviewed and are negative.     Blood pressure 128/84, pulse 110, temperature 99 F (37.2 C), temperature source Oral, resp. rate 18, height 5\' 5"  (1.651 m), weight 99.066 kg (218 lb 6.4 oz), SpO2 100.00%. Maternal Exam:  Uterine Assessment: Contraction frequency is regular.   Abdomen: Fetal presentation: vertex     Fetal Exam Fetal State Assessment: Category I - tracings are normal.     Physical Exam  Nursing note and vitals reviewed. Constitutional: She appears well-developed.  HENT:  Head: Normocephalic.  Eyes: Pupils are equal, round, and reactive to light.  Neck: Normal range of motion.  Cardiovascular: Normal rate and regular rhythm.   Respiratory: Effort normal and breath sounds normal.    Prenatal labs: ABO, Rh:   Antibody:   Rubella:   RPR:    HBsAg:    HIV:    GBS:     Assessment/Plan: IUP at 38 weeks Previous C Section GDM Gestational Hypertension Enlarged fetal kidney - upper limits of normal  IUPC placed Patient counseled on risks of uterine rupture - consents Follow labor curve Pitocin low dose Peds to evaluate fetal kidney after birth Fitzgerald L 03/25/2014, 2:51 PM

## 2014-03-25 NOTE — Anesthesia Preprocedure Evaluation (Signed)
Anesthesia Evaluation  Patient identified by MRN, date of birth, ID band Patient awake    Reviewed: Allergy & Precautions, H&P , NPO status , Patient's Chart, lab work & pertinent test results  Airway Mallampati: II TM Distance: >3 FB Neck ROM: full    Dental no notable dental hx.    Pulmonary neg pulmonary ROS,    Pulmonary exam normal       Cardiovascular negative cardio ROS      Neuro/Psych negative psych ROS   GI/Hepatic negative GI ROS, Neg liver ROS,   Endo/Other  diabetes, Gestational  Renal/GU negative Renal ROS     Musculoskeletal   Abdominal Normal abdominal exam  (+)   Peds  Hematology negative hematology ROS (+)   Anesthesia Other Findings   Reproductive/Obstetrics (+) Pregnancy                           Anesthesia Physical Anesthesia Plan  ASA: II  Anesthesia Plan: Epidural   Post-op Pain Management:    Induction:   Airway Management Planned:   Additional Equipment:   Intra-op Plan:   Post-operative Plan:   Informed Consent: I have reviewed the patients History and Physical, chart, labs and discussed the procedure including the risks, benefits and alternatives for the proposed anesthesia with the patient or authorized representative who has indicated his/her understanding and acceptance.     Plan Discussed with:   Anesthesia Plan Comments:         Anesthesia Quick Evaluation

## 2014-03-25 NOTE — MAU Note (Signed)
Pt sent from MD office for elevated BP, Bp up in office, protein in urine.  Pt denies HA, visual changes or epigastric pain.

## 2014-03-25 NOTE — MAU Note (Signed)
Urine in lab 

## 2014-03-26 ENCOUNTER — Encounter (HOSPITAL_COMMUNITY): Payer: Self-pay | Admitting: *Deleted

## 2014-03-26 LAB — CBC
HCT: 32.9 % — ABNORMAL LOW (ref 36.0–46.0)
Hemoglobin: 11.1 g/dL — ABNORMAL LOW (ref 12.0–15.0)
MCH: 27.5 pg (ref 26.0–34.0)
MCHC: 33.7 g/dL (ref 30.0–36.0)
MCV: 81.4 fL (ref 78.0–100.0)
Platelets: 258 10*3/uL (ref 150–400)
RBC: 4.04 MIL/uL (ref 3.87–5.11)
RDW: 15.1 % (ref 11.5–15.5)
WBC: 15.7 10*3/uL — ABNORMAL HIGH (ref 4.0–10.5)

## 2014-03-26 MED ORDER — HYDROCORTISONE ACE-PRAMOXINE 1-1 % RE FOAM
1.0000 | Freq: Two times a day (BID) | RECTAL | Status: DC
  Administered 2014-03-26 – 2014-03-27 (×3): 1 via RECTAL
  Filled 2014-03-26 (×2): qty 10

## 2014-03-26 NOTE — Anesthesia Postprocedure Evaluation (Signed)
Anesthesia Post Note  Patient: Susan Andrade  Procedure(s) Performed: * No procedures listed *  Anesthesia type: Epidural  Patient location: Mother/Baby  Post pain: Pain level controlled  Post assessment: Post-op Vital signs reviewed  Last Vitals:  Filed Vitals:   03/26/14 0431  BP: 117/67  Pulse: 106  Temp: 36.5 C  Resp: 18    Post vital signs: Reviewed  Level of consciousness: awake  Complications: No apparent anesthesia complications

## 2014-03-26 NOTE — Progress Notes (Signed)
Post Partum Day 1 Subjective: up ad lib, voiding, tolerating PO and c/o hemorrhoid  Objective: Blood pressure 117/67, pulse 106, temperature 97.7 F (36.5 C), temperature source Oral, resp. rate 18, height 5\' 5"  (1.651 m), weight 218 lb (98.884 kg), SpO2 98.00%, unknown if currently breastfeeding.  Physical Exam:  General: alert and cooperative Lochia: appropriate Uterine Fundus: firm Incision: perineum intact, small hemorrhoid DVT Evaluation: No evidence of DVT seen on physical exam. Negative Homan's sign. No cords or calf tenderness. No significant calf/ankle edema.   Recent Labs  03/25/14 1428 03/26/14 0605  HGB 12.4 11.1*  HCT 36.5 32.9*    Assessment/Plan: Plan for discharge tomorrow, baby has h/o renal cyst, desires to hold circ until renal ultrasound  pro[facility]   LOS: 1 day   Jahmar Mckelvy G 03/26/2014, 8:07 AM

## 2014-03-26 NOTE — Lactation Note (Signed)
This note was copied from the chart of [place]. Lactation Consultation Note  Patient Name: Susan Andrade SWNIO'E Date: 03/26/2014 Reason for consult: Initial assessment Mom reports history of BF her 1st child for up to 1 year, but reports the first several weeks she struggled with sore, cracked nipples and pain. This eventually resolved however Mom asked LC to evaluate this baby for tongue tie. She feels her 1st child had tongue tie that was not diagnosed. At this visit, anterior frenulum is noted with finger sweep. Some restriction of tongue mobility from side to side and baby will not extend his tongue past his lower gum line. The upper labial frenulum is thick and attached down at alveolar ridge. Mom feels baby is compressing nipple when breastfeeding and not keeping his lips well flanged. She reports some blisters on the end of her left nipple after the last feeding. They have resolved at this visit but her nipples are red, tender per her report.  Assisted Mom with positioning and obtaining good depth with latch. LC noted at this visit baby does keep his lips tucked required frequent assist to flange lips. Mom reported some mild tenderness with initial latch, we adjusted the lips and the tenderness improved, however at the end of the feeding, slight nipple compression noted. Care for sore nipples reviewed with Mom. Gave her another pair of comfort gels as she dropped one of the last pair on the floor. Advised to discuss the frenulum with her Peds for possible referral to ENT or dentist. Basic teaching reviewed. Lactation brochure left for review, advised of OP services and support group. Encouraged to call for assist as needed.   Maternal Data Formula Feeding for Exclusion: No Has patient been taught Hand Expression?: Yes Does the patient have breastfeeding experience prior to this delivery?: Yes  Feeding Feeding Type: Breast Fed Length of feed: 15 min  LATCH  Score/Interventions Latch: Grasps breast easily, tongue down, lips flanged, rhythmical sucking.  Audible Swallowing: A few with stimulation  Type of Nipple: Everted at rest and after stimulation  Comfort (Breast/Nipple): Filling, red/small blisters or bruises, mild/mod discomfort  Problem noted: Mild/Moderate discomfort Interventions (Mild/moderate discomfort): Comfort gels;Hand expression;Hand massage  Hold (Positioning): Assistance needed to correctly position infant at breast and maintain latch. Intervention(s): Breastfeeding basics reviewed;Support Pillows;Position options;Skin to skin  LATCH Score: 7  Lactation Tools Discussed/Used     Consult Status Consult Status: Follow-up Date: 03/26/14 Follow-up type: In-patient    Katrine Coho 03/26/2014, 12:56 PM

## 2014-03-27 MED ORDER — HYDROCORTISONE ACE-PRAMOXINE 1-1 % RE FOAM: 1.0000 | Freq: Two times a day (BID) | RECTAL | Status: DC

## 2014-03-27 MED ORDER — IBUPROFEN 600 MG PO TABS: 600.0000 mg | ORAL_TABLET | Freq: Four times a day (QID) | ORAL | Status: DC

## 2014-03-27 MED ORDER — OXYCODONE-ACETAMINOPHEN 5-325 MG PO TABS: 1.0000 | ORAL_TABLET | ORAL | Status: DC | PRN

## 2014-03-27 NOTE — Discharge Summary (Signed)
Obstetric Discharge Summary Reason for Admission: onset of labor Prenatal Procedures: ultrasound Intrapartum Procedures: spontaneous vaginal delivery Postpartum Procedures: none Complications-Operative and Postpartum: 2 degree perineal laceration Hemoglobin  Date Value Ref Range Status  03/26/2014 11.1* 12.0 - 15.0 g/dL Final     HCT  Date Value Ref Range Status  03/26/2014 32.9* 36.0 - 46.0 % Final    Physical Exam:  General: alert and cooperative Lochia: appropriate Uterine Fundus: firm Incision: perineum intact DVT Evaluation: No evidence of DVT seen on physical exam. Negative Homan's sign. No cords or calf tenderness. No significant calf/ankle edema.  Discharge Diagnoses: Term Pregnancy-delivered  Discharge Information: Date: 03/27/2014 Activity: pelvic rest Diet: routine Medications: PNV, Ibuprofen and Percocet Condition: stable Instructions: refer to practice specific booklet Discharge to: home   Newborn Data: Live born female  Birth Weight: 7 lb 1.1 oz (3205 g) APGAR: 4, 7  Home with mother and desires circ prior to discharge.  Simpson Paulos G 03/27/2014, 8:55 AM

## 2014-03-27 NOTE — Plan of Care (Signed)
Problem: Discharge Progression Outcomes Goal: MMR given as ordered Outcome: Not Applicable Date Met:  40/37/54 Pt refused MMR due to past history of Guilliam Barre Syndrome

## 2014-03-31 ENCOUNTER — Inpatient Hospital Stay (HOSPITAL_COMMUNITY): Admission: RE | Admit: 2014-03-31 | Payer: BC Managed Care – PPO | Source: Ambulatory Visit

## 2014-04-01 ENCOUNTER — Inpatient Hospital Stay (HOSPITAL_COMMUNITY)
Admission: RE | Admit: 2014-04-01 | Payer: BC Managed Care – PPO | Source: Ambulatory Visit | Admitting: Obstetrics and Gynecology

## 2014-04-01 ENCOUNTER — Encounter (HOSPITAL_COMMUNITY): Admission: RE | Payer: Self-pay | Source: Ambulatory Visit

## 2014-04-01 SURGERY — Surgical Case
Anesthesia: Regional

## 2014-04-03 ENCOUNTER — Ambulatory Visit (HOSPITAL_COMMUNITY)
Admission: RE | Admit: 2014-04-03 | Discharge: 2014-04-03 | Disposition: A | Discharge status: Home / Self Care | Payer: BC Managed Care – PPO | Source: Ambulatory Visit | Attending: Obstetrics and Gynecology | Admitting: Obstetrics and Gynecology

## 2014-04-03 NOTE — Lactation Note (Signed)
Lactation Consult  Mother's reason for visit:  WEIGHT CHECK AND LATCH CHECK Visit Type:  FEEDING ASSESSMENT Appointment Notes:  NONE Consult:  Initial Lactation Consultant:  Ave Filter  ________________________________________________________________________  68 Name: Susan Andrade  Date of Birth: 03/25/2015  Pediatrician: LITTLE  Gender: female  Gestational Age: [redacted]w[redacted]d (At Birth)  Birth Weight: 7 lb 1.1 oz (3205 g)  Weight at Discharge: Weight: 6 lb 13.4 oz (3100 g) Date of Discharge: 03/27/2014  [facility] Weights   03/25/14 2040 03/27/14 0256  Weight: 7 lb 1.1 oz (3205 g) 6 lb 13.4 oz (3100 g)  [facility] weight taken from location [facility] of [facility]:7-2 ON 03/31/14 Location:[facility]  Weight today: 7-4.6   ________________________________________________________________________  Mother's Name: Otho Ket Type of delivery:  VBAC Breastfeeding Experience:  BREASTFED FIRST BABY FOR ONE YEAT Maternal Medical Conditions:  Gestational diabetes mellitis Maternal Medications:  MOTRIN  ________________________________________________________________________  Breastfeeding History (Post Discharge)  Frequency of breastfeeding:  EVERY 2-4 HOURS Duration of feeding:  5-20 MINUTES ONE BREAST  Patient does not supplement or pump.  Infant Intake and Output Assessment  Voids:  8-10+ in 24 hrs.  Color:  Clear yellow Stools:  6-8+ in 24 hrs.  Color:  Yellow  ________________________________________________________________________  Maternal Breast Assessment  Breast:  Full Nipple:  Erect Pain level:  0 Pain interventions:  NONE  _______________________________________________________________________ Feeding Assessment/Evaluation  Mom and 23 day old baby here for feeding assessment.  Mom has several concerns and questions regarding feeds, baby's sleepiness and two episodes of color change around lips with fast letdown.  She has not seen this happen in past few days and she  did notify the pediatrician of this occuring.  Mom has a history of oversupply and again has an abundant milk supply.  Observed baby at breast latched well and active sucking.  Many swallows and gulps heard but baby paused to rest during feedings and no choking spells or color changes.  Reassured mom but instructed to call pediatrician if this occurs again.  Reviewed position changes that may help with infant cope with flow.  Baby transferred 114 mls from one breast.  Mom left more reassured.  Breastfeeding support group strongly encouraged.  Initial feeding assessment:  Infant's oral assessment:  WNL  Positioning:  Cross cradle Left breast  LATCH documentation:  Latch:  2 = Grasps breast easily, tongue down, lips flanged, rhythmical sucking.  Audible swallowing:  2 = Spontaneous and intermittent  Type of nipple:  2 = Everted at rest and after stimulation  Comfort (Breast/Nipple):  2 = Soft / non-tender  Hold (Positioning):  2 = No assistance needed to correctly position infant at breast  LATCH score:  10  Attached assessment:  Deep  Lips flanged:  Yes.    Lips untucked:  No.  Suck assessment:  Nutritive  Tools:  NONE   Pre-feed weight:  3304 g   Post-feed weight:  3418 g  Amount transferred:  114 ml Amount supplemented:  0 ml      Total amount transferred:  114 ml Total supplement given:  0 ml

## 2014-05-08 ENCOUNTER — Other Ambulatory Visit: Payer: Self-pay | Admitting: Obstetrics and Gynecology

## 2014-05-09 LAB — CYTOLOGY - PAP

## 2014-06-23 ENCOUNTER — Encounter (HOSPITAL_COMMUNITY): Payer: Self-pay | Admitting: *Deleted

## 2014-07-08 ENCOUNTER — Encounter: Payer: Self-pay | Admitting: Cardiology

## 2014-07-08 ENCOUNTER — Ambulatory Visit (INDEPENDENT_AMBULATORY_CARE_PROVIDER_SITE_OTHER): Payer: BC Managed Care – PPO | Admitting: Cardiology

## 2014-07-08 VITALS — BP 130/80 | HR 86 | Ht 65.0 in | Wt 200.5 lb

## 2014-07-08 DIAGNOSIS — Q249 Congenital malformation of heart, unspecified: Secondary | ICD-10-CM

## 2014-07-08 DIAGNOSIS — I471 Supraventricular tachycardia: Secondary | ICD-10-CM

## 2014-07-08 DIAGNOSIS — R000 Tachycardia, unspecified: Secondary | ICD-10-CM | POA: Insufficient documentation

## 2014-07-08 NOTE — Progress Notes (Signed)
   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  She saw me in 2011 during a previous pregnancy. She had an unremarkable pregnancy although she did require a C-section for noncardiac reasons. She did have gestational diabetes and now has hyperglycemia.    I saw her recently before her second pregnancy. She had some tachycardia. She had C section on August 04.  Since that time she has done well. She did okay with the delivery. She's had no tachycardia palpitations. The patient denies any new symptoms such as chest discomfort, neck or arm discomfort. There has been no new shortness of breath, PND or orthopnea. There has been no presyncope or syncope.    Allergies  Allergen Reactions  . Sulfonamide Derivatives Other (See Comments)    Family history of rxn; pt does not want to take  . Amoxicillin Hives and Rash  . Latex Rash  . Penicillins Hives and Rash  . Sudafed [Pseudoephedrine] Palpitations    Current Outpatient Prescriptions  Medication Sig Dispense Refill  . Prenatal Vit-Fe Fumarate-FA (PRENATAL MULTIVITAMIN) TABS tablet Take 1 tablet by mouth at bedtime.      No current facility-administered medications for this visit.    Past Medical History  Diagnosis Date  . Sinusitis-bronchiectasis-situs inversus syndrome   . Polycystic ovary disease   . Leukocytoclastic vasculitis   . Hyperglycemia   . DM (diabetes mellitus) in pregnancy   . Retinal hemorrhage   . GBS (Guillain Barre syndrome)   . Situs inversus totalis     diagnosed when 33 years old    Past Surgical History  Procedure Laterality Date  . Cesarean section      2010  . Dilation and evacuation N/A 10/10/2012    Procedure: DILATATION AND EVACUATION;  Surgeon: Cyril Mourning, MD;  Location: [facility];  Service: Gynecology;  Laterality: N/A;  . Dilation and evacuation N/A 10/13/2012    Procedure: DILATATION AND EVACUATION UNDER ULTRASOUND GUIDENCE;  Surgeon: Marylynn Pearson, MD;  Location: [facility];  Service: Gynecology;  Laterality: N/A;    ROS:   As stated in the HPI and negative for all other systems.  PHYSICAL EXAM BP 130/80 mmHg  Pulse 86  Ht 5\' 5"  (1.651 m)  Wt 200 lb 8 oz (90.946 kg)  BMI 33.36 kg/m2 GENERAL:  Well appearing NECK:  No jugular venous distention, waveform within normal limits, carotid upstroke brisk and symmetric, no bruits, no thyromegaly LUNGS:  Clear to auscultation bilaterally CHEST:  Unremarkable HEART:  PMI is on the right side ,S1 and S2 within normal limits, no S3, no S4, no clicks, no rubs, no murmurs ABD:  Flat, positive bowel sounds normal in frequency in pitch, no bruits, no rebound, no guarding, no midline pulsatile mass, no hepatomegaly, no splenomegal EXT:  2 plus pulses throughout, no edema, no cyanosis no clubbing   EKG:   (Right sided leads)  Sinus tachycardia, rate 87, left axis deviation, poor anterior R wave progression, no acute ST-T wave changes, no change from previous.  07/08/2014  ASSESSMENT AND PLAN  SITUS INVERSUS:    The patient has had no symptoms related to this and no associated congenital abnormalities. He flow murmur has resolved.  No change in therapy is indicated.   TACHYCARDIA:  This has resolved.  No further evaluation is indicated.

## 2014-07-08 NOTE — Patient Instructions (Signed)
Continue same medications   Your physician wants you to follow-up in: call in 2 years 06/2016. You will receive a reminder letter in the mail two months in advance. If you don't receive a letter, please call our office to schedule the follow-up appointment.

## 2014-10-06 IMAGING — US US OB DETAIL+14 WK
1 series · 12 of 28 positions shown · non-contrast
Comparison: none

OBSTETRICS REPORT
                      (Signed Final 02/26/2014 [DATE])

Service(s) Provided
 US OB DETAIL + 14 WK                                  76811.0
Indications
 Detailed fetal anatomic survey
 History of genetic / anatomic abnormality - pt has
 situs inversus totalis with vasculitis
 Diabetes - Gestational, A2 (currently on Metformin)
 Poor obstetric history: Previous gestational
 diabetes
 Fetal abnormality - other known or suspected
 (enlarged left fetal kidney)
 Polycystic Ovarian Syndrome
Fetal Evaluation
 Num Of Fetuses:    1
 Fetal Heart Rate:  142                          bpm
 Cardiac Activity:  Observed
 Presentation:      Cephalic
 Placenta:          Right lateral, above
                    cervical os
 P. Cord            Not well visualized
 Insertion:
 Amniotic Fluid
 AFI FV:      Subjectively within normal limits
 AFI Sum:     12.43   cm       37  %Tile     Larg Pckt:    6.41  cm
 RUQ:   0.03    cm   RLQ:    1.2    cm    LUQ:   6.41    cm   LLQ:    4.79   cm
Biometry
 BPD:     91.1  mm     G. Age:  37w 0d                CI:         77.7   70 - 86
 OFD:    117.2  mm                                    FL/HC:      18.3   19.4 -
 HC:     336.1  mm     G. Age:  38w 4d     > 97  %    HC/AC:      1.00   0.96 -
 AC:     336.7  mm     G. Age:  37w 4d     > 97  %    FL/BPD:     67.4   71 - 87
 FL:      61.4  mm     G. Age:  31w 6d        3  %    FL/AC:      18.2   20 - 24
 HUM:     55.5  mm     G. Age:  32w 2d       20  %
 Est. FW:    6446  gm      6 lb 6 oz     89  %
Gestational Age
 LMP:           34w 1d        Date:  07/02/13                 EDD:   04/08/14
 U/S Today:     36w 2d                                        EDD:   03/24/14
 Best:          34w 1d     Det. By:  LMP  (07/02/13)          EDD:   04/08/14
Anatomy
 Cranium:          Appears normal         Aortic Arch:      Not well visualized
 Fetal Cavum:      Not well visualized    Ductal Arch:      Not well visualized
 Ventricles:       Appears normal         Diaphragm:        Appears normal
 Choroid Plexus:   Not well visualized    Stomach:          Appears normal, left
                                                            sided
 Cerebellum:       Not well visualized    Abdomen:          Appears normal
 Posterior Fossa:  Not well visualized    Abdominal Wall:   Not well visualized
 Nuchal Fold:      Not applicable (>20    Cord Vessels:     Appears normal (3
                   wks GA)                                  vessel cord)
 Face:             Orbits appear          Kidneys:          Appear normal
                   normal
 Lips:             Appears normal         Bladder:          Appears normal
 Palate:           Not well visualized    Spine:            Limited views
                                                            appear normal
 Heart:            Not well visualized    Lower             Not well visualized
                                          Extremities:
 RVOT:             Not well visualized    Upper             Not well visualized
 LVOT:             Not well visualized
 Other:  Fetus appears to be a male. Technicallly difficult due to advanced GA.
Cervix Uterus Adnexa
 Cervix:       Not visualized (advanced GA >04wks)
 Adnexa:     No abnormality visualized.
Impression
INDICATION: 32 yr old PBSGOGG at 78w8d with situs inversus
 and gestational diabetes for fetal ultrasound secondary to
 concern for enlarged fetal kidney on outside ultrasound.

[Series 1: us ob detail+14 wk · 12 of 52 slices shown]
[im 2/52]
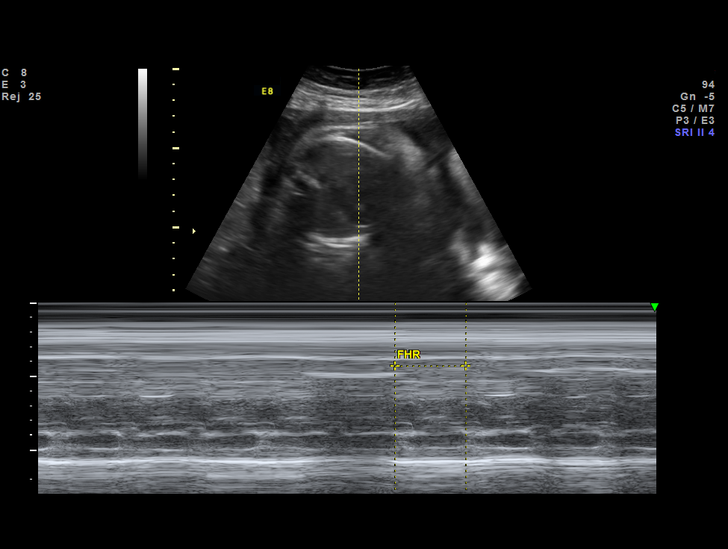
[im 6/52]
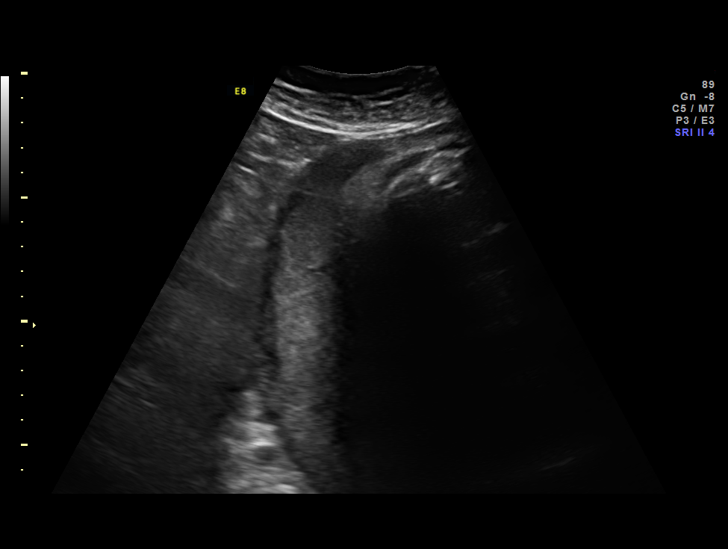
[im 10/52]
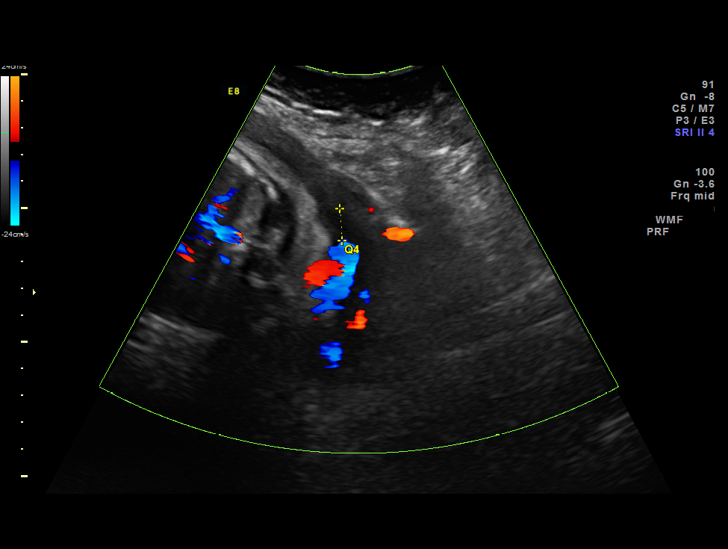
[im 16/52]
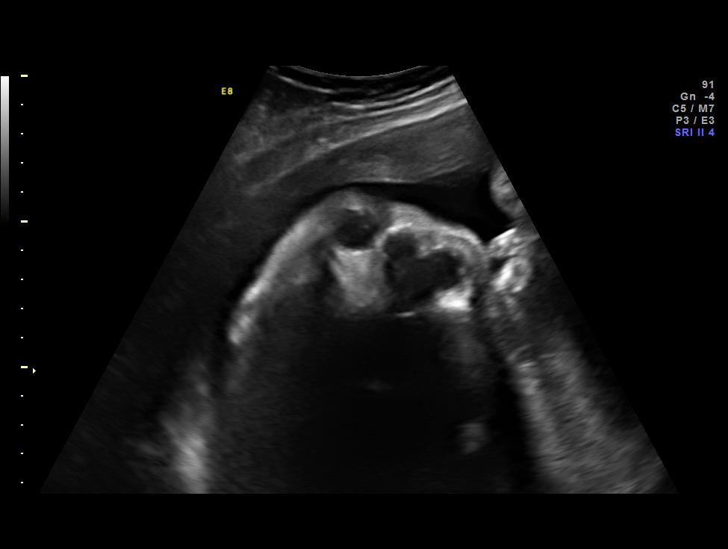
[im 19/52]
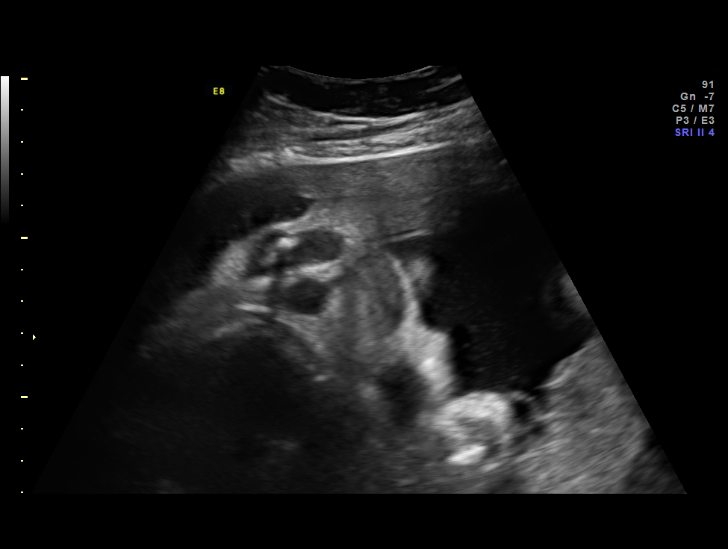
[im 23/52]
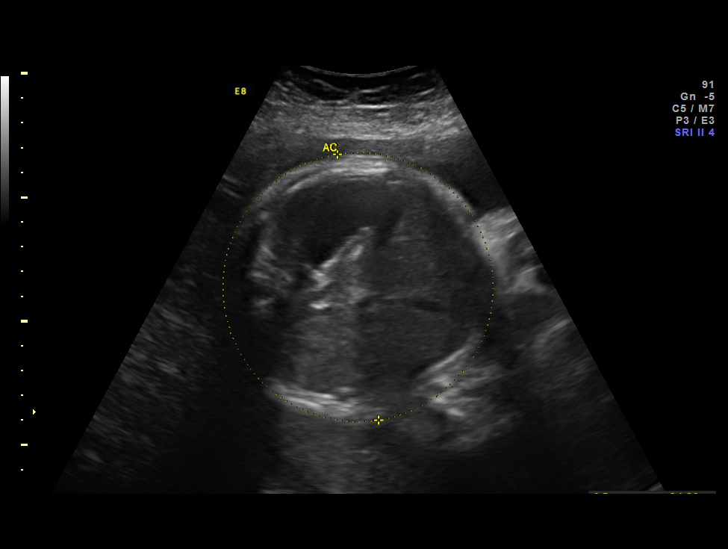
[im 29/52]
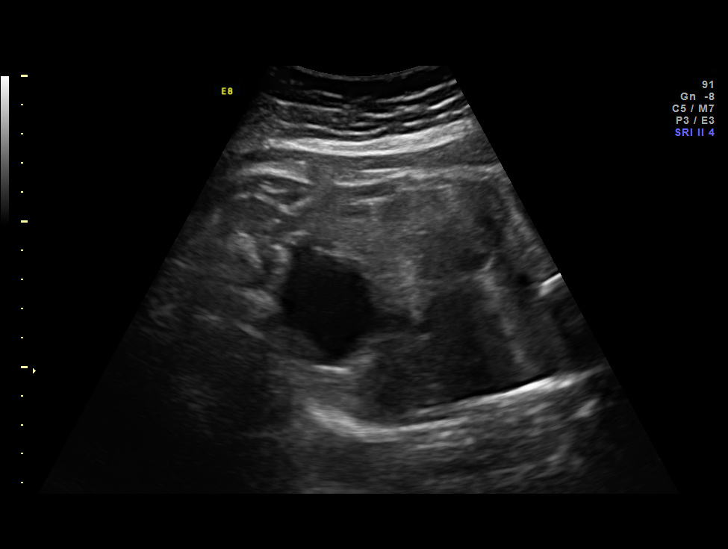
[im 33/52]
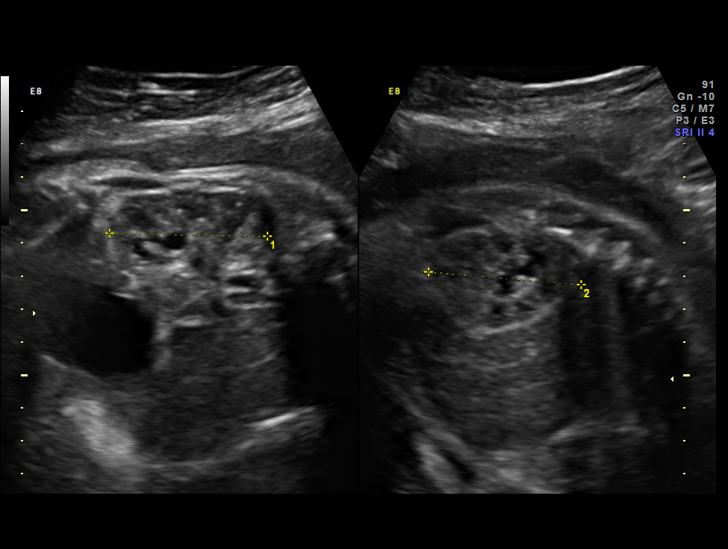
[im 36/52]
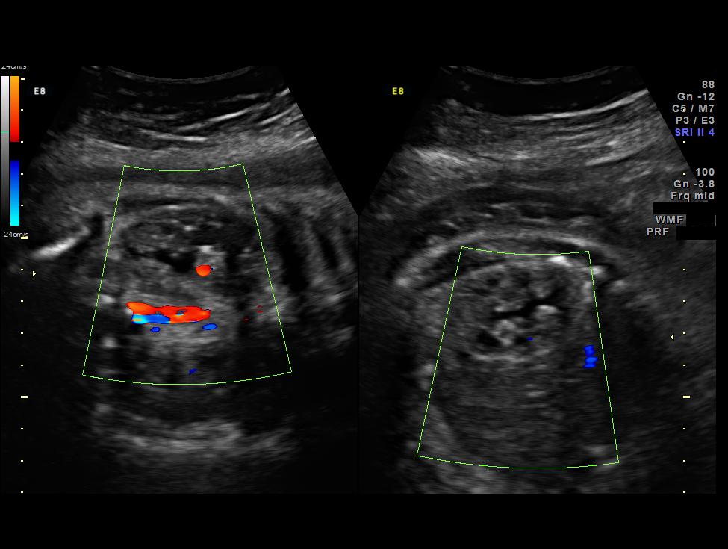
[im 42/52]
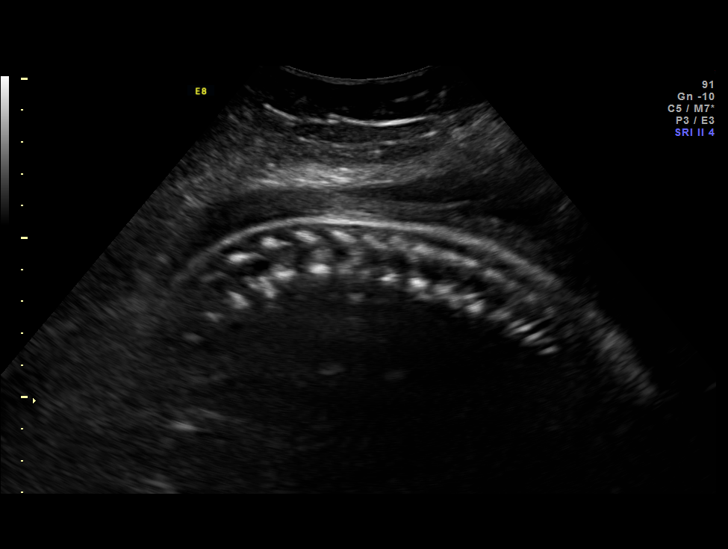
[im 46/52]
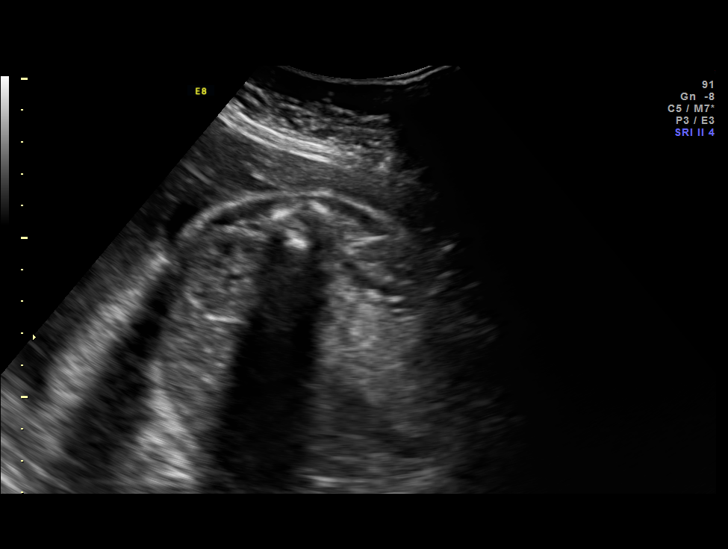
[im 50/52]
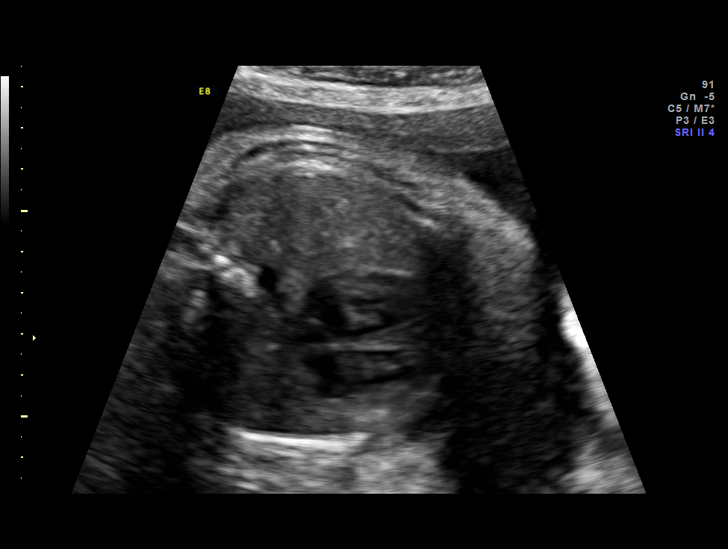

[12 of 28 positions shown; findings below may reference images not displayed]

FINDINGS: 1. Single intrauterine pregnancy.
 2. Estimated fetal weight is in the 89th%.
 3. Right lateral placenta without evidence of previa.
 4. Normal amniotic fluid index.
 5. The anatomy is limited as above by advanced gestational
 age; no abnormalities were seen.
 6. The left fetal kidney length measures at the top end of
 normal at 5.2cm; the right kidney appears normal.
Recommendations

 1. Appropriate fetal growth; although accelerated:
 - discussed this is likely due to gestational diabetes;
 recommend strict glucose control
 2. Normal limited anatomy survey.
 3. Gestational diabetes:
 - on metformin
 - recommend strict glucose control
 - discussed association with macrosomia and neonatal
 hypoglycemia
 - recommend fetal growth every 4 weeks
 - recommend antenatal testing
 - recommend delivery by estimated due date but not prior to
 39 weeks in the absence of other complications
 4. Discussed left fetal kidney measures at the top end of
 normal
 - structurally appears normal
 - recommend inform Pediatrics at delivery
 5. Situs inversus:
 - patient reports had normal fetal echocardiogram
 6. Normal first trimester screen

 questions or concerns.

## 2015-02-16 ENCOUNTER — Other Ambulatory Visit: Payer: Self-pay

## 2015-05-13 ENCOUNTER — Other Ambulatory Visit: Payer: Self-pay | Admitting: Obstetrics and Gynecology

## 2015-05-14 LAB — CYTOLOGY - PAP

## 2016-02-12 ENCOUNTER — Encounter: Payer: Self-pay | Admitting: Genetic Counselor

## 2016-02-22 ENCOUNTER — Telehealth: Payer: Self-pay | Admitting: Cardiology

## 2016-02-22 NOTE — Telephone Encounter (Signed)
Spoke to patient Patient states she has been having a pulsating throbbing in neck for 2-3 weeks no chest pain patient has not contact primary. Requesting a an appointment with Dr Malka So.  not available until Aug 2017-  Appointment offer this week  patient not available appointment schedule for 03/02/16 with extender. Patient verbalized understandindg

## 2016-02-22 NOTE — Telephone Encounter (Signed)
Ne MEssage  Pt calling to speak w'/ rN about irregular pulse. Please call back and discuss.

## 2016-03-02 ENCOUNTER — Encounter: Payer: Self-pay | Admitting: Physician Assistant

## 2016-03-02 ENCOUNTER — Ambulatory Visit (INDEPENDENT_AMBULATORY_CARE_PROVIDER_SITE_OTHER): Payer: BLUE CROSS/BLUE SHIELD | Admitting: Physician Assistant

## 2016-03-02 VITALS — BP 102/70 | HR 92 | Ht 65.0 in | Wt 230.4 lb

## 2016-03-02 DIAGNOSIS — Q249 Congenital malformation of heart, unspecified: Secondary | ICD-10-CM

## 2016-03-02 DIAGNOSIS — R0989 Other specified symptoms and signs involving the circulatory and respiratory systems: Secondary | ICD-10-CM

## 2016-03-02 DIAGNOSIS — Q893 Situs inversus: Secondary | ICD-10-CM

## 2016-03-02 DIAGNOSIS — R000 Tachycardia, unspecified: Secondary | ICD-10-CM | POA: Diagnosis not present

## 2016-03-02 NOTE — Patient Instructions (Addendum)
Medication Instructions:  Your physician recommends that you continue on your current medications as directed. Please refer to the Current Medication list given to you today.   Labwork: ON THE DAY OF YOUR ECHOCARDIOGRAM:  TSH  Testing/Procedures: Your physician has requested that you have an echocardiogram. Echocardiography is a painless test that uses sound waves to create images of your heart. It provides your doctor with information about the size and shape of your heart and how well your heart's chambers and valves are working. This procedure takes approximately one hour. There are no restrictions for this procedure.  Your physician has requested that you have a carotid duplex. This test is an ultrasound of the carotid arteries in your neck. It looks at blood flow through these arteries that supply the brain with blood. Allow one hour for this exam. There are no restrictions or special instructions.    Follow-Up: Your physician wants you to follow-up in: [place] DR. HOCHREIN You will receive a reminder letter in the mail two months in advance. If you don't receive a letter, please call our office to schedule the follow-up appointment.    Any Other Special Instructions Will Be Listed Below (If Applicable). Echocardiogram An echocardiogram, or echocardiography, uses sound waves (ultrasound) to produce an image of your heart. The echocardiogram is simple, painless, obtained within a short period of time, and offers valuable information to your health care provider. The images from an echocardiogram can provide information such as:  Evidence of coronary artery disease (CAD).  Heart size.  Heart muscle function.  Heart valve function.  Aneurysm detection.  Evidence of a past heart attack.  Fluid buildup around the heart.  Heart muscle thickening.  Assess heart valve function. LET [facility] CARE PROVIDER KNOW ABOUT:  Any allergies you have.  All medicines you are  taking, including vitamins, herbs, eye drops, creams, and over-the-counter medicines.  Previous problems you or members of your family have had with the use of anesthetics.  Any blood disorders you have.  Previous surgeries you have had.  Medical conditions you have.  Possibility of pregnancy, if this applies. BEFORE THE PROCEDURE  No special preparation is needed. Eat and drink normally.  PROCEDURE   In order to produce an image of your heart, gel will be applied to your chest and a wand-like tool (transducer) will be moved over your chest. The gel will help transmit the sound waves from the transducer. The sound waves will harmlessly bounce off your heart to allow the heart images to be captured in real-time motion. These images will then be recorded.  You may need an IV to receive a medicine that improves the quality of the pictures. AFTER THE PROCEDURE You may return to your normal schedule including diet, activities, and medicines, unless your health care provider tells you otherwise.   This information is not intended to replace advice given to you by your health care provider. Make sure you discuss any questions you have with your health care provider.   Document Released: 08/05/2000 Document Revised: 08/29/2014 Document Reviewed: 04/15/2013 Elsevier Interactive Patient Education [employer].     If you need a refill on your cardiac medications before your next appointment, please call your pharmacy.

## 2016-03-02 NOTE — Progress Notes (Signed)
Cardiology Office Note    Date:  03/02/2016   ID:  Susan Andrade, DOB [birth year], MRN [id number]  PCP:  Susan Noon, MD  Cardiologist:  Dr. Percival Andrade  Chief Complaint  Patient presents with  . Follow-up    seen for Dr. Percival Andrade    History of Present Illness:  Susan Andrade is a 35 y.o. 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 She had a normal echocardiogram on 10/01/2007. In 2011, she successfully delivered a child via C-section for noncardiac reasons. Her last follow-up was on 07/08/2014, at which time she was doing well.   She presents today for follow-up for her situs inversus. She has been fairly active. 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 Her GYN physician has started her back on metformin for hyperglycemia in the setting of polycystic ovarian disease. There has also been some discussion to start on spironolactone. I have discussed with our clinical pharmacist and neither drug should have significant contraindication given her history of situs inversus. For the past several weeks, however, she has been having some vague nonspecific discomfort in the neck and frequently describing it as having pulsatile sensation. She has not noticed this before and in the past several weeks, it has been more and more noticeable which is concerning for her. I'm not entirely sure was causing her symptom. It is very difficult to appreciate bruit on exam, however there does not seems to be very significant bruit. Given her age, I have relatively lower suspicion for carotid stenosis, however we will use a carotid ultrasound to reassess the area. Her last echocardiogram was over 8 years ago, I will repeat another echocardiogram. If  both tests are normal, I have instructed her to continue to observe the symptom and follow-up with Dr. Percival Andrade in 1 year. Occasionally, ear infection with lymph node involvement can cause similar symptom, however she does not have any of those signs. Her BP is fairly low today and her HR is in 90s, but it is likely that she is just more aware of her pulse recently. I will obtain a TSH given the fact that she does have mild tachycardia and given PCOS, she may not have the normal thin body features most hyperthyroidism patient has. It is possible her mild tachycardia and even the new pulsatile sensation is related to PCOS due to altered autonomic sympathetic activities. If tachycardia worsen, we may have to consider labetalol 100mg  BID.     Past Medical History  Diagnosis Date  . Sinusitis-bronchiectasis-situs inversus syndrome   . Polycystic ovary disease   . Leukocytoclastic vasculitis (Susan Andrade)   . Hyperglycemia   . DM (diabetes mellitus) in pregnancy (Susan Andrade)   . Retinal hemorrhage   . GBS (Guillain Barre syndrome) (Susan Andrade)   . Situs inversus totalis     diagnosed when 35 years old    Past Surgical History  Procedure Laterality Date  . Cesarean section      2010  . Dilation and evacuation N/A 10/10/2012    Procedure: DILATATION AND EVACUATION;  Surgeon: Susan Mourning, MD;  Location: [facility];  Service: Gynecology;  Laterality: N/A;  . Dilation and evacuation N/A 10/13/2012    Procedure: DILATATION AND EVACUATION UNDER ULTRASOUND GUIDENCE;  Surgeon: Susan Pearson, MD;  [facility];  Service: Gynecology;  Laterality: N/A;    Current Medications: Outpatient Prescriptions Prior to Visit  Medication Sig Dispense Refill  . Prenatal Vit-Fe Fumarate-FA (PRENATAL MULTIVITAMIN) TABS tablet Take 1 tablet by mouth at bedtime.      No facility-administered medications prior to visit.     Allergies:   Pseudoephedrine hcl; Sulfonamide derivatives; Amoxicillin; Latex; Penicillins; and Sudafed    Social History   Social History  . Marital Status: Married    Spouse Name: N/A  . Number of Children: 1  . Years of Education: N/A   Occupational History  . [employer]   . [employer]    Social History Main Topics  . Smoking status: Never Smoker   . Smokeless tobacco: Never Used  . Alcohol Use: No  . Drug Use: No  . Sexual Activity:    Partners: Male   Other Topics Concern  . None   Social History Narrative     Family History:  The patient's family history includes Aneurysm (age of onset: 20) in her maternal grandmother; Arthritis in her mother; Coronary artery disease (age of onset: 19) in her maternal grandfather; Melanoma in her father; Pancreatic cancer (age of onset: 57) in her father. There is no history of Heart attack.   ROS:   Please see the history of present illness.    ROS All other systems reviewed and are negative.   PHYSICAL EXAM:   VS:  BP 102/70 mmHg  Pulse 92  Ht 5\' 5"  (1.651 m)  Wt 230 lb 6.4 oz (104.509 kg)  BMI 38.34 kg/m2   GEN: Well nourished, well developed, in no acute distress HEENT: normal Neck: no JVD, carotid bruits, or masses Cardiac: RRR; no murmurs, rubs, or gallops,no edema  Respiratory:  clear to auscultation bilaterally, normal work of breathing GI: soft, nontender, nondistended, + BS MS: no deformity or atrophy Skin: warm and dry, no rash Neuro:  Alert and Oriented x 3, Strength and sensation are intact Psych: euthymic mood, full affect  Wt Readings from Last 3 Encounters:  03/02/16 230 lb 6.4 oz (104.509 kg)  07/08/14 200 lb 8 oz (90.946 kg)  03/25/14 218 lb (98.884 kg)      Studies/Labs Reviewed:   EKG:  R sided EKG is ordered today with reversed leads given situs inversus.  The ekg ordered today demonstrates NSR, compare to previous EKG, less R wave progression in anterior leads  Recent Labs: No results found for requested labs within last 365 days.   Lipid Panel    Component Value Date/Time   CHOL 194  04/19/2011 0937   TRIG 91.0 04/19/2011 0937   HDL 39.90 04/19/2011 0937   CHOLHDL 5 04/19/2011 0937   VLDL 18.2 04/19/2011 0937   LDLCALC 136* 04/19/2011 0937    Additional studies/ records that were reviewed today include:   10/01/2007 LEFT VENTRICLE: - Left ventricular size was normal. - Overall left ventricular systolic function was normal. - There were no left ventricular regional wall motion    abnormalities. - Left ventricular wall thickness was normal.  AORTIC VALVE: - The aortic valve was trileaflet. - Aortic valve thickness was normal.  AORTA: - The aortic root was normal in size.  MITRAL VALVE: - Mitral valve structure was normal.  LEFT ATRIUM: - Left atrial size was normal.  RIGHT VENTRICLE: - Right ventricular size was normal. - Right ventricular systolic function was normal. - Right ventricular wall thickness was normal.  PULMONIC VALVE: - The pulmonic valve was  grossly normal. - The pulmonic valve was not well visualized.  TRICUSPID VALVE: - The tricuspid valve was not well visualized.  PULMONARY ARTERY: - The pulmonary artery was not well visualized. Possibly enlarged.  RIGHT ATRIUM: - The right atrium was not well visualized.  SYSTEMIC VEINS: - The inferior vena cava was not well visualized.  PERICARDIUM: - There was no pericardial effusion. ---------------------------------------------------------------  SUMMARY - Overall left ventricular systolic function was normal. There were    no left ventricular regional wall motion abnormalities.   ASSESSMENT:    1. Congenital anomaly of heart   2. Sinus tachycardia ([place])   3. Situs inversus   4. Abnormal carotid pulse      PLAN:  In order of problems listed above:  1. Pulsatile sensation near bilateral carotid location: unclear cause, i think regular intermittent pulsatile sensation is normal, however her symptom seems to be  more frequent than most people. Given her h/o congenital heart issue, will repeat echo. Also obtain carotid U/S. If both are negative, would not pursue any further workup and followup in 1 year. Check TSH  - It is possible her mild tachycardia and even the new pulsatile sensation is related to PCOS due to altered autonomic sympathetic activities. If tachycardia worsen, we may have to consider labetalol 100mg  BID.   2. Situs inversus: doing well, no exertional symptom  3.  PCOS: i have discussed with our clinical pharmacist, both metformin and spironolactone should not have interaction with her situs inversus  4. H/o Guillain Barre syndrome: diagnosed when she was young, no obvious residual    Medication Adjustments/Labs and Tests Ordered: Current medicines are reviewed at length with the patient today.  Concerns regarding medicines are outlined above.  Medication changes, Labs and Tests ordered today are listed in the Patient Instructions below. Patient Instructions  Medication Instructions:  Your physician recommends that you continue on your current medications as directed. Please refer to the Current Medication list given to you today.   Labwork: ON THE DAY OF YOUR ECHOCARDIOGRAM:  TSH  Testing/Procedures: Your physician has requested that you have an echocardiogram. Echocardiography is a painless test that uses sound waves to create images of your heart. It provides your doctor with information about the size and shape of your heart and how well your heart's chambers and valves are working. This procedure takes approximately one hour. There are no restrictions for this procedure.  Your physician has requested that you have a carotid duplex. This test is an ultrasound of the carotid arteries in your neck. It looks at blood flow through these arteries that supply the brain with blood. Allow one hour for this exam. There are no restrictions or special instructions.    Follow-Up: Your  physician wants you to follow-up in: [place] DR. HOCHREIN You will receive a reminder letter in the mail two months in advance. If you don't receive a letter, please call our office to schedule the follow-up appointment.    Any Other Special Instructions Will Be Listed Below (If Applicable). Echocardiogram An echocardiogram, or echocardiography, uses sound waves (ultrasound) to produce an image of your heart. The echocardiogram is simple, painless, obtained within a short period of time, and offers valuable information to your health care provider. The images from an echocardiogram can provide information such as:  Evidence of coronary artery disease (CAD).  Heart size.  Heart muscle function.  Heart valve function.  Aneurysm detection.  Evidence of a past heart attack.  Fluid buildup  around the heart.  Heart muscle thickening.  Assess heart valve function. LET [facility] CARE PROVIDER KNOW ABOUT:  Any allergies you have.  All medicines you are taking, including vitamins, herbs, eye drops, creams, and over-the-counter medicines.  Previous problems you or members of your family have had with the use of anesthetics.  Any blood disorders you have.  Previous surgeries you have had.  Medical conditions you have.  Possibility of pregnancy, if this applies. BEFORE THE PROCEDURE  No special preparation is needed. Eat and drink normally.  PROCEDURE   In order to produce an image of your heart, gel will be applied to your chest and a wand-like tool (transducer) will be moved over your chest. The gel will help transmit the sound waves from the transducer. The sound waves will harmlessly bounce off your heart to allow the heart images to be captured in real-time motion. These images will then be recorded.  You may need an IV to receive a medicine that improves the quality of the pictures. AFTER THE PROCEDURE You may return to your normal schedule including diet, activities,  and medicines, unless your health care provider tells you otherwise.   This information is not intended to replace advice given to you by your health care provider. Make sure you discuss any questions you have with your health care provider.   Document Released: 08/05/2000 Document Revised: 08/29/2014 Document Reviewed: 04/15/2013 Elsevier Interactive Patient Education [employer].     If you need a refill on your cardiac medications before your next appointment, please call your pharmacy.       Hilbert Corrigan, [place]  03/02/2016 4:51 PM    [facility] [place], [place], [place]  [address] Phone: [phone]; Fax: [phone]

## 2016-03-07 ENCOUNTER — Other Ambulatory Visit: Payer: Self-pay | Admitting: Physician Assistant

## 2016-03-07 DIAGNOSIS — R0989 Other specified symptoms and signs involving the circulatory and respiratory systems: Secondary | ICD-10-CM

## 2016-03-14 ENCOUNTER — Ambulatory Visit (HOSPITAL_COMMUNITY)
Admission: RE | Admit: 2016-03-14 | Discharge: 2016-03-14 | Disposition: A | Discharge status: Home / Self Care | Payer: BLUE CROSS/BLUE SHIELD | Source: Ambulatory Visit | Attending: Cardiovascular Disease | Admitting: Cardiovascular Disease

## 2016-03-14 DIAGNOSIS — R0989 Other specified symptoms and signs involving the circulatory and respiratory systems: Secondary | ICD-10-CM | POA: Diagnosis not present

## 2016-03-14 DIAGNOSIS — E119 Type 2 diabetes mellitus without complications: Secondary | ICD-10-CM | POA: Diagnosis not present

## 2016-03-21 ENCOUNTER — Other Ambulatory Visit (HOSPITAL_COMMUNITY): Payer: BLUE CROSS/BLUE SHIELD

## 2016-03-21 ENCOUNTER — Other Ambulatory Visit: Payer: BLUE CROSS/BLUE SHIELD

## 2016-03-28 ENCOUNTER — Other Ambulatory Visit (INDEPENDENT_AMBULATORY_CARE_PROVIDER_SITE_OTHER): Payer: BLUE CROSS/BLUE SHIELD

## 2016-03-28 ENCOUNTER — Other Ambulatory Visit: Payer: Self-pay

## 2016-03-28 ENCOUNTER — Ambulatory Visit (HOSPITAL_COMMUNITY): Payer: BLUE CROSS/BLUE SHIELD | Attending: Cardiovascular Disease

## 2016-03-28 DIAGNOSIS — Z6838 Body mass index (BMI) 38.0-38.9, adult: Secondary | ICD-10-CM | POA: Diagnosis not present

## 2016-03-28 DIAGNOSIS — E119 Type 2 diabetes mellitus without complications: Secondary | ICD-10-CM | POA: Insufficient documentation

## 2016-03-28 DIAGNOSIS — I340 Nonrheumatic mitral (valve) insufficiency: Secondary | ICD-10-CM | POA: Diagnosis not present

## 2016-03-28 DIAGNOSIS — Q893 Situs inversus: Secondary | ICD-10-CM

## 2016-03-28 DIAGNOSIS — R000 Tachycardia, unspecified: Secondary | ICD-10-CM

## 2016-03-28 DIAGNOSIS — E669 Obesity, unspecified: Secondary | ICD-10-CM | POA: Diagnosis not present

## 2016-03-28 DIAGNOSIS — Q249 Congenital malformation of heart, unspecified: Secondary | ICD-10-CM

## 2016-03-28 LAB — TSH: TSH: 1.04 mIU/L

## 2016-04-22 ENCOUNTER — Telehealth: Payer: Self-pay | Admitting: Physician Assistant

## 2016-04-22 NOTE — Telephone Encounter (Signed)
New message       Returning a call to the nurse to get echo echo results

## 2016-04-22 NOTE — Telephone Encounter (Signed)
Returned pts call and went over echo results.  She has been advised to f/u in a year with Dr. Percival Spanish.  Pt agreeable with this plan and verbalized understanding.

## 2016-04-27 ENCOUNTER — Telehealth: Payer: Self-pay | Admitting: Genetic Counselor

## 2016-04-27 NOTE — Telephone Encounter (Signed)
Ms. Eustaquio had some questions about updated genetic testing options, per the letter that was sent to her from our office.  I discussed that we do have additional genes that we now consider on testing related to pancreatic and melanoma cancer risk, that we previously did not consider when she had genetic testing in 2013.  Discussed that it could be helpful for her to come back in for a genetic counseling session and discuss.  She also had some questions about genetics of hydronephrosis and recurrence risks for future pregnancies.  She is interested in carrier testing for this.  I let her know that I can look into this for her, but a prenatal genetic counselor would definitely be the expert in this area.  Referred her to [email or web address] to find prenatal GCs in the area.  She is interested in updated genetic testing for hereditary cancer, but she is worried about cost.  I will reach out to the lab and get a benefits investigation running for her.  Let her know it takes about 3 days--will call her next week, once I learn the outcome.  Gave her my number to contact me with other questions.  Encouraged her to continue with setting up an GC appt with [facility].

## 2016-05-02 ENCOUNTER — Telehealth: Payer: Self-pay | Admitting: Genetic Counselor

## 2016-05-02 NOTE — Telephone Encounter (Signed)
Called Susan Andrade and left a message to let her know that I do not yet have results back from the benefits investigation we ordered.  I will be out the rest of the week, returning on Monday, September 18th.  Hopefully the BI information will be back by then and I will call her with those results.

## 2016-05-11 ENCOUNTER — Telehealth: Payer: Self-pay | Admitting: Genetic Counselor

## 2016-05-11 NOTE — Telephone Encounter (Signed)
Susan Andrade had previously reached out because she was interested in updated genetic testing options due to her father's history of melanoma and pancreatic cancer.  We discussed the option of panel testing through [facility] and she decided she would like to have a benefits investigation performed prior to her coming in for a genetic counseling appt.  Her BI OOP cost for melanoma panel testing was determined to be $300, but that she is likely eligible for a reduced cost through [employer]'s Patient Assistance Program based on household number and income.  Looks like her cost for testing will be $60 after this is applied.  She is very interested in including all pancreatic cancer genes too and some, though not all, are included on this panel.  Unfortunately she does not meet medical criteria for full pancreatic gene panel testing, so she would have to pay self-pay cost of $475.  She will think about testing and will call back if she is interested in a genetic counseling appt.  We also discussed her question about recurrence risk of hydronephrosis in future pregnancies.  There is not much, I found about this, so it seems understanding of genetic interplay is still minimal.  [name] book states that most cases of unilateral hydronephrosis are "not obviously familial."  Advised her to reach out to a prenatal genetic counselor with further questions, as they are more so experts in that field of genetics.

## 2016-08-26 DIAGNOSIS — Z79899 Other long term (current) drug therapy: Secondary | ICD-10-CM | POA: Diagnosis not present

## 2016-08-26 DIAGNOSIS — E288 Other ovarian dysfunction: Secondary | ICD-10-CM | POA: Diagnosis not present

## 2016-08-26 DIAGNOSIS — Z8632 Personal history of gestational diabetes: Secondary | ICD-10-CM | POA: Diagnosis not present

## 2016-08-26 DIAGNOSIS — E282 Polycystic ovarian syndrome: Secondary | ICD-10-CM | POA: Diagnosis not present

## 2016-11-14 DIAGNOSIS — F4323 Adjustment disorder with mixed anxiety and depressed mood: Secondary | ICD-10-CM | POA: Diagnosis not present

## 2016-12-02 DIAGNOSIS — F4323 Adjustment disorder with mixed anxiety and depressed mood: Secondary | ICD-10-CM | POA: Diagnosis not present

## 2016-12-13 DIAGNOSIS — F4323 Adjustment disorder with mixed anxiety and depressed mood: Secondary | ICD-10-CM | POA: Diagnosis not present

## 2016-12-29 DIAGNOSIS — F4323 Adjustment disorder with mixed anxiety and depressed mood: Secondary | ICD-10-CM | POA: Diagnosis not present

## 2017-04-03 ENCOUNTER — Inpatient Hospital Stay (HOSPITAL_COMMUNITY)
Admission: AD | Admit: 2017-04-03 | Discharge: 2017-04-04 | Disposition: A | Discharge status: Home / Self Care | Payer: 59 | Source: Ambulatory Visit | Attending: Obstetrics and Gynecology | Admitting: Obstetrics and Gynecology

## 2017-04-03 ENCOUNTER — Encounter (HOSPITAL_COMMUNITY): Payer: Self-pay | Admitting: Advanced Practice Midwife

## 2017-04-03 DIAGNOSIS — Z7984 Long term (current) use of oral hypoglycemic drugs: Secondary | ICD-10-CM | POA: Diagnosis not present

## 2017-04-03 DIAGNOSIS — Z79899 Other long term (current) drug therapy: Secondary | ICD-10-CM | POA: Diagnosis not present

## 2017-04-03 DIAGNOSIS — Z880 Allergy status to penicillin: Secondary | ICD-10-CM | POA: Diagnosis not present

## 2017-04-03 DIAGNOSIS — R42 Dizziness and giddiness: Secondary | ICD-10-CM | POA: Insufficient documentation

## 2017-04-03 DIAGNOSIS — E282 Polycystic ovarian syndrome: Secondary | ICD-10-CM | POA: Diagnosis not present

## 2017-04-03 DIAGNOSIS — Z800 Family history of malignant neoplasm of digestive organs: Secondary | ICD-10-CM | POA: Diagnosis not present

## 2017-04-03 DIAGNOSIS — Z808 Family history of malignant neoplasm of other organs or systems: Secondary | ICD-10-CM | POA: Insufficient documentation

## 2017-04-03 DIAGNOSIS — Z888 Allergy status to other drugs, medicaments and biological substances status: Secondary | ICD-10-CM | POA: Insufficient documentation

## 2017-04-03 DIAGNOSIS — R1084 Generalized abdominal pain: Secondary | ICD-10-CM | POA: Insufficient documentation

## 2017-04-03 DIAGNOSIS — Z91040 Latex allergy status: Secondary | ICD-10-CM | POA: Insufficient documentation

## 2017-04-03 DIAGNOSIS — Q893 Situs inversus: Secondary | ICD-10-CM | POA: Diagnosis not present

## 2017-04-03 DIAGNOSIS — Z882 Allergy status to sulfonamides status: Secondary | ICD-10-CM | POA: Diagnosis not present

## 2017-04-03 DIAGNOSIS — R110 Nausea: Secondary | ICD-10-CM | POA: Diagnosis not present

## 2017-04-03 DIAGNOSIS — Z8261 Family history of arthritis: Secondary | ICD-10-CM | POA: Diagnosis not present

## 2017-04-03 DIAGNOSIS — R109 Unspecified abdominal pain: Secondary | ICD-10-CM | POA: Diagnosis present

## 2017-04-03 LAB — URINALYSIS, ROUTINE W REFLEX MICROSCOPIC
Bilirubin Urine: NEGATIVE
Glucose, UA: NEGATIVE mg/dL
Hgb urine dipstick: NEGATIVE
KETONES UR: 5 mg/dL — AB
Nitrite: NEGATIVE
Protein, ur: NEGATIVE mg/dL
Specific Gravity, Urine: 1.004 — ABNORMAL LOW (ref 1.005–1.030)
pH: 5 (ref 5.0–8.0)

## 2017-04-03 LAB — CBC WITH DIFFERENTIAL/PLATELET
BASOS PCT: 0 %
Basophils Absolute: 0 10*3/uL (ref 0.0–0.1)
EOS PCT: 0 %
Eosinophils Absolute: 0.1 10*3/uL (ref 0.0–0.7)
HEMATOCRIT: 39.3 % (ref 36.0–46.0)
Hemoglobin: 12.6 g/dL (ref 12.0–15.0)
LYMPHS PCT: 20 %
Lymphs Abs: 2.5 10*3/uL (ref 0.7–4.0)
MCH: 25.2 pg — ABNORMAL LOW (ref 26.0–34.0)
MCHC: 32.1 g/dL (ref 30.0–36.0)
MCV: 78.6 fL (ref 78.0–100.0)
Monocytes Absolute: 0.4 10*3/uL (ref 0.1–1.0)
Monocytes Relative: 3 %
NEUTROS PCT: 77 %
Neutro Abs: 9.5 10*3/uL — ABNORMAL HIGH (ref 1.7–7.7)
PLATELETS: 313 10*3/uL (ref 150–400)
RBC: 5 MIL/uL (ref 3.87–5.11)
RDW: 14.2 % (ref 11.5–15.5)
WBC: 12.5 10*3/uL — ABNORMAL HIGH (ref 4.0–10.5)

## 2017-04-03 LAB — POCT PREGNANCY, URINE: Preg Test, Ur: NEGATIVE

## 2017-04-03 NOTE — MAU Provider Note (Signed)
Chief Complaint:  Abdominal Pain and Nausea   First Provider Initiated Contact with Patient 04/03/17 2308     HPI: Susan Andrade is a 36 y.o. [id number] who presents to maternity admissions reporting episoded of severe lower abdominal pain this evening, about 6pm.   Lasted a while, forced her to ball up in fetal position.  Then it got better but is still sore in the lower abdomen.  Had a bowel movement which did not change the pain. It was of normal consistency.   Has been nauseated for 2 weeks.  Period is 7 days late.  Even though she has PCOS, late period is unusual for her.  Not on any contraception.. She reports no vaginal bleeding, vaginal itching/burning, urinary symptoms, h/a, dizziness, vomiting,  or fever/chills.    History is also remarkable for total situs inversus.  Abdominal Pain  This is a new problem. The current episode started today. The onset quality is sudden. The problem occurs constantly. The problem has been rapidly improving. The pain is located in the LLQ, RLQ and suprapubic region. The pain is severe (was severe, not as much now). The quality of the pain is cramping, aching and colicky. The abdominal pain does not radiate. Associated symptoms include nausea. Pertinent negatives include no anorexia, constipation, diarrhea, dysuria, fever, frequency, headaches, myalgias or vomiting. Nothing aggravates the pain. The pain is relieved by being still and certain positions. She has tried nothing for the symptoms. situs inversus   RN Note: Pt here with lower abdominal pain that started suddenly about 6 tonight. Nausea and lightheadedness. Period is late. Some pain radiates to back.    Past Medical History: Past Medical History:  Diagnosis Date  . DM (diabetes mellitus) in pregnancy   . GBS (Guillain Barre syndrome) ([facility])   . Hyperglycemia   . Leukocytoclastic vasculitis ([place])   . Polycystic ovary disease   . Retinal hemorrhage   . Sinusitis-bronchiectasis-situs inversus  syndrome   . 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 Please review and update pregnancy details.      Past Surgical History: Past Surgical History:  Procedure Laterality Date  . CESAREAN SECTION     2010  . DILATION AND EVACUATION N/A 10/10/2012   Procedure: DILATATION AND EVACUATION;  Surgeon: Cyril Mourning, MD;  Location: [facility];  Service: Gynecology;  Laterality: N/A;  . DILATION AND EVACUATION N/A 10/13/2012   Procedure: DILATATION AND EVACUATION UNDER ULTRASOUND GUIDENCE;  Surgeon: Marylynn Pearson, MD;  Location: [facility];  Service: Gynecology;  Laterality: N/A;    Family History: Family History  Problem Relation Age of Onset  . Arthritis Mother   . Aneurysm Maternal Grandmother 80       Cerebral  . Coronary artery disease Maternal Grandfather 50  . Pancreatic cancer Father 78  . Melanoma Father        dx in early 62s  .  Heart attack Neg Hx     Social History: Social History  Substance Use Topics  . Smoking status: Never Smoker  . Smokeless tobacco: Never Used  . Alcohol use No    Allergies:  Allergies  Allergen Reactions  . Pseudoephedrine Hcl Palpitations  . Sulfonamide Derivatives Other (See Comments)    Family history of rxn; pt does not want to take  . Amoxicillin Hives and Rash  . Latex Rash  . Penicillins Hives and Rash  . Sudafed [Pseudoephedrine] Palpitations    Meds:  Prescriptions Prior to Admission  Medication Sig Dispense Refill Last Dose  . metFORMIN (GLUCOPHAGE-XR) 500 MG 24 hr tablet Take 500 mg by mouth daily with breakfast.    Taking  .  mometasone (NASONEX) 50 MCG/ACT nasal spray Place 2 sprays into the nose daily. ALLERGIES   Taking  . Prenatal Vit-Fe Fumarate-FA (PRENATAL MULTIVITAMIN) TABS tablet Take 1 tablet by mouth at bedtime.    Taking  . spironolactone (ALDACTONE) 50 MG tablet Take 50 mg by mouth daily.    Taking    I have reviewed patient's Past Medical Hx, Surgical Hx, Family Hx, Social Hx, medications and allergies.  ROS:  Review of Systems  Constitutional: Negative for fever.  Gastrointestinal: Positive for abdominal pain and nausea. Negative for anorexia, constipation, diarrhea and vomiting.  Genitourinary: Negative for dysuria and frequency.  Musculoskeletal: Negative for myalgias.  Neurological: Negative for headaches.   Other systems negative     Physical Exam  Patient Vitals for the past 24 hrs:  BP Temp Temp src Pulse Resp SpO2 Height Weight  04/03/17 2013 134/82 98.2 F (36.8 C) Oral (!) 105 20 100 % 5\' 5"  (1.651 m) 228 lb (103.4 kg)   Constitutional: Well-developed, well-nourished female in no acute distress.  Cardiovascular: normal rate and rhythm, no ectopy audible, S1 & S2 heard, no murmur Respiratory: normal effort, no distress. Lungs CTAB with no wheezes or crackles GI: Abd soft, mild-moderately tender throughout lower abdomen. Some point tenderness LLQ with deep palpation. .  Nondistended.  No rebound, No guarding.  Bowel Sounds audible  MS: Extremities nontender, no edema, normal ROM Neurologic: Alert and oriented x 4.   Grossly nonfocal. GU: Neg CVAT. Skin:  Warm and Dry Psych:  Affect appropriate.  PELVIC EXAM: Bimanual exam: Cervix firm, anterior, Multiple nodules palpable on and around cervix (?Nabothian cysts),  neg CMT, uterus nontender, nonenlarged, adnexa without tenderness, enlargement, or mass    Labs:    Results for orders placed or performed during the hospital encounter of 04/03/17 (from the past 24 hour(s))  Urinalysis, Routine w reflex microscopic     Status:  Abnormal   Collection Time: 04/03/17  7:21 PM  Result Value Ref Range   Color, Urine STRAW (A) YELLOW   APPearance CLEAR CLEAR   Specific Gravity, Urine 1.004 (L) 1.005 - 1.030   pH 5.0 5.0 - 8.0   Glucose, UA NEGATIVE NEGATIVE mg/dL   Hgb urine dipstick NEGATIVE NEGATIVE   Bilirubin Urine NEGATIVE NEGATIVE   Ketones, ur 5 (A) NEGATIVE mg/dL   Protein, ur NEGATIVE NEGATIVE mg/dL   Nitrite NEGATIVE NEGATIVE   Leukocytes, UA SMALL (A) NEGATIVE   RBC / HPF 0-5 0 - 5 RBC/hpf   WBC, UA 0-5 0 - 5 WBC/hpf   Bacteria, UA RARE (A) NONE SEEN   Squamous Epithelial / LPF 0-5 (A) NONE SEEN  Pregnancy, urine POC     Status: None   Collection Time: 04/03/17  8:50 PM  Result Value Ref Range   Preg Test, Ur NEGATIVE NEGATIVE  CBC with Differential/Platelet     Status: Abnormal   Collection Time: 04/03/17 11:39 PM  Result Value Ref Range   WBC 12.5 (H) 4.0 - 10.5 K/uL   RBC 5.00 3.87 - 5.11 MIL/uL   Hemoglobin 12.6 12.0 - 15.0 g/dL   HCT 39.3 36.0 - 46.0 %   MCV 78.6 78.0 - 100.0 fL   MCH 25.2 (L) 26.0 - 34.0 pg   MCHC 32.1 30.0 - 36.0 g/dL   RDW 14.2 11.5 - 15.5 %   Platelets 313 150 - 400 K/uL   Neutrophils Relative % 77 %   Neutro Abs 9.5 (H) 1.7 - 7.7 K/uL   Lymphocytes Relative 20 %   Lymphs Abs 2.5 0.7 - 4.0 K/uL   Monocytes Relative 3 %   Monocytes Absolute 0.4 0.1 - 1.0 K/uL   Eosinophils Relative 0 %   Eosinophils Absolute 0.1 0.0 - 0.7 K/uL   Basophils Relative 0 %   Basophils Absolute 0.0 0.0 - 0.1 K/uL  Comprehensive metabolic panel     Status: Abnormal   Collection Time: 04/03/17 11:39 PM  Result Value Ref Range   Sodium 136 135 - 145 mmol/L   Potassium 4.1 3.5 - 5.1 mmol/L   Chloride 101 101 - 111 mmol/L   CO2 25 22 - 32 mmol/L   Glucose, Bld 104 (H) 65 - 99 mg/dL   BUN 9 6 - 20 mg/dL   Creatinine, Ser 0.59 0.44 - 1.00 mg/dL   Calcium 9.1 8.9 - 10.3 mg/dL   Total Protein 7.6 6.5 - 8.1 g/dL   Albumin 4.4 3.5 - 5.0 g/dL   AST 20 15 - 41 U/L   ALT 19 14 - 54 U/L    Alkaline Phosphatase 75 38 - 126 U/L   Total Bilirubin 0.6 0.3 - 1.2 mg/dL   GFR calc non Af Amer >60 >60 mL/min   GFR calc Af Amer >60 >60 mL/min   Anion gap 10 5 - 15  Amylase     Status: None   Collection Time: 04/03/17 11:39 PM  Result Value Ref Range   Amylase 40 28 - 100 U/L  Lipase, blood     Status: None   Collection Time: 04/03/17 11:39 PM  Result Value Ref Range   Lipase 24 11 - 51 U/L     Imaging:  No results found.  MAU Course/MDM: I have ordered labs as follows:  UA, UPT, CBC/diff, CMET, amylase, lipase Imaging ordered: none. Will probably need [place] in office per Dr Julien Girt Results reviewed. WBC slightly elevated  Consult Dr Julien Girt.  If WBC less than 15k, would discharge home with conservative care and followup tomorrow in office if she is still having pain   Might need an [place] in office.  Pelvic exam not exceptionally tender to exam, so doubt gynecologic source of pain, although without [place] we cannot exclude it.   Will call office if still having pain  .   Pt stable at time of discharge.  Assessment: Generalized abdominal pain - Plan: Discharge patient   Plan: Discharge home Recommend Call offic e in am if still in pain   Encouraged to return here or to other Urgent Care/ED if she develops worsening of symptoms, increase in pain, fever, or other concerning symptoms.   Hansel Feinstein CNM, MSN Certified Nurse-Midwife 04/03/2017 11:08 PM

## 2017-04-03 NOTE — MAU Note (Signed)
Pt here with lower abdominal pain that started suddenly about 6 tonight. Nausea and lightheadedness. Period is late. Some pain radiates to back.

## 2017-04-04 ENCOUNTER — Other Ambulatory Visit (HOSPITAL_COMMUNITY): Payer: Self-pay | Admitting: Obstetrics and Gynecology

## 2017-04-04 DIAGNOSIS — E282 Polycystic ovarian syndrome: Secondary | ICD-10-CM | POA: Diagnosis not present

## 2017-04-04 DIAGNOSIS — Z7984 Long term (current) use of oral hypoglycemic drugs: Secondary | ICD-10-CM | POA: Diagnosis not present

## 2017-04-04 DIAGNOSIS — R1084 Generalized abdominal pain: Secondary | ICD-10-CM | POA: Diagnosis not present

## 2017-04-04 DIAGNOSIS — R110 Nausea: Secondary | ICD-10-CM | POA: Diagnosis not present

## 2017-04-04 DIAGNOSIS — Z8261 Family history of arthritis: Secondary | ICD-10-CM | POA: Diagnosis not present

## 2017-04-04 DIAGNOSIS — Z808 Family history of malignant neoplasm of other organs or systems: Secondary | ICD-10-CM | POA: Diagnosis not present

## 2017-04-04 DIAGNOSIS — R102 Pelvic and perineal pain: Secondary | ICD-10-CM | POA: Diagnosis not present

## 2017-04-04 DIAGNOSIS — Z79899 Other long term (current) drug therapy: Secondary | ICD-10-CM | POA: Diagnosis not present

## 2017-04-04 DIAGNOSIS — R42 Dizziness and giddiness: Secondary | ICD-10-CM | POA: Diagnosis not present

## 2017-04-04 DIAGNOSIS — Q893 Situs inversus: Secondary | ICD-10-CM | POA: Diagnosis not present

## 2017-04-04 LAB — COMPREHENSIVE METABOLIC PANEL
ALBUMIN: 4.4 g/dL (ref 3.5–5.0)
ALT: 19 U/L (ref 14–54)
AST: 20 U/L (ref 15–41)
Alkaline Phosphatase: 75 U/L (ref 38–126)
Anion gap: 10 (ref 5–15)
BUN: 9 mg/dL (ref 6–20)
CO2: 25 mmol/L (ref 22–32)
CREATININE: 0.59 mg/dL (ref 0.44–1.00)
Calcium: 9.1 mg/dL (ref 8.9–10.3)
Chloride: 101 mmol/L (ref 101–111)
GFR calc Af Amer: >60 mL/min (ref >60)
GFR calc non Af Amer: >60 mL/min (ref >60)
Glucose, Bld: 104 mg/dL — ABNORMAL HIGH (ref 65–99)
POTASSIUM: 4.1 mmol/L (ref 3.5–5.1)
SODIUM: 136 mmol/L (ref 135–145)
Total Bilirubin: 0.6 mg/dL (ref 0.3–1.2)
Total Protein: 7.6 g/dL (ref 6.5–8.1)

## 2017-04-04 LAB — LIPASE, BLOOD: Lipase: 24 U/L (ref 11–51)

## 2017-04-04 LAB — AMYLASE: Amylase: 40 U/L (ref 28–100)

## 2017-04-04 NOTE — Discharge Instructions (Signed)

## 2017-04-05 ENCOUNTER — Ambulatory Visit (HOSPITAL_COMMUNITY)
Admission: RE | Admit: 2017-04-05 | Discharge: 2017-04-05 | Disposition: A | Discharge status: Home / Self Care | Payer: 59 | Source: Ambulatory Visit | Attending: Obstetrics and Gynecology | Admitting: Obstetrics and Gynecology

## 2017-04-05 ENCOUNTER — Encounter (HOSPITAL_COMMUNITY): Payer: Self-pay | Admitting: Radiology

## 2017-04-05 DIAGNOSIS — Q893 Situs inversus: Secondary | ICD-10-CM | POA: Insufficient documentation

## 2017-04-05 DIAGNOSIS — R109 Unspecified abdominal pain: Secondary | ICD-10-CM | POA: Diagnosis not present

## 2017-04-05 DIAGNOSIS — R102 Pelvic and perineal pain: Secondary | ICD-10-CM | POA: Diagnosis not present

## 2017-04-05 MED ORDER — IOPAMIDOL (ISOVUE-300) INJECTION 61%: 100.0000 mL | Freq: Once | INTRAVENOUS | Status: DC | PRN

## 2017-04-10 DIAGNOSIS — H52201 Unspecified astigmatism, right eye: Secondary | ICD-10-CM | POA: Diagnosis not present

## 2017-04-10 DIAGNOSIS — H5213 Myopia, bilateral: Secondary | ICD-10-CM | POA: Diagnosis not present

## 2017-04-13 DIAGNOSIS — R102 Pelvic and perineal pain: Secondary | ICD-10-CM | POA: Diagnosis not present

## 2017-04-26 DIAGNOSIS — R102 Pelvic and perineal pain: Secondary | ICD-10-CM | POA: Diagnosis not present

## 2017-06-02 DIAGNOSIS — L738 Other specified follicular disorders: Secondary | ICD-10-CM | POA: Diagnosis not present

## 2017-06-02 DIAGNOSIS — L720 Epidermal cyst: Secondary | ICD-10-CM | POA: Diagnosis not present

## 2017-07-07 DIAGNOSIS — E282 Polycystic ovarian syndrome: Secondary | ICD-10-CM | POA: Diagnosis not present

## 2017-07-07 DIAGNOSIS — Z01419 Encounter for gynecological examination (general) (routine) without abnormal findings: Secondary | ICD-10-CM | POA: Diagnosis not present

## 2017-07-07 DIAGNOSIS — L680 Hirsutism: Secondary | ICD-10-CM | POA: Diagnosis not present

## 2017-07-07 DIAGNOSIS — Z01411 Encounter for gynecological examination (general) (routine) with abnormal findings: Secondary | ICD-10-CM | POA: Diagnosis not present

## 2017-07-07 DIAGNOSIS — Z6836 Body mass index (BMI) 36.0-36.9, adult: Secondary | ICD-10-CM | POA: Diagnosis not present

## 2017-07-07 DIAGNOSIS — E559 Vitamin D deficiency, unspecified: Secondary | ICD-10-CM | POA: Diagnosis not present

## 2017-08-09 DIAGNOSIS — R102 Pelvic and perineal pain: Secondary | ICD-10-CM | POA: Diagnosis not present

## 2018-04-11 NOTE — Progress Notes (Signed)
Cardiology Office Note   Date:  04/12/2018   ID:  Chesley, Valls 1980/12/02, MRN [id number]  PCP:  Chesley Noon, MD  Cardiologist:   No primary care provider on file.  Chief Complaint  Patient presents with  . Palpitations      History of Present Illness: Susan Andrade is a 37 y.o. female who presents for follow up of  situs inversus totalis. I have not seen her since 2015 .  She was last seen in 2017 and had a pulsatile feeling in her neck but there was no clear reason for this.  She had a follow up CT.   She had a normal echo aside from the situs inversus.      She is frustrated because she has not had any weight loss.  She says her diet has been really strict in order for her to lose any weight.  She is not really too physically active.  She is also had some palpitations.  She describes these a couple of times as a rapid heart rate that would happen sporadically.  She did not notice it with activity.  She was not having any chest pressure, neck or arm discomfort.  She has not been having any presyncope or syncope.  No new shortness of breath, PND or orthopnea.      Past Medical History:  Diagnosis Date  . DM (diabetes mellitus) in pregnancy   . GBS (Guillain Barre syndrome) ([facility])   . Hyperglycemia   . Leukocytoclastic vasculitis ([facility])   . Polycystic ovary disease   . Retinal hemorrhage   . Sinusitis-bronchiectasis-situs inversus syndrome   . Situs inversus totalis    diagnosed when 37 years old    Past Surgical History:  Procedure Laterality Date  . CESAREAN SECTION     2010  . DILATION AND EVACUATION N/A 10/10/2012   Procedure: DILATATION AND EVACUATION;  Surgeon: Cyril Mourning, MD;  Location: [facility];  Service: Gynecology;  Laterality: N/A;  . DILATION AND EVACUATION N/A 10/13/2012   Procedure: DILATATION AND EVACUATION UNDER ULTRASOUND GUIDENCE;  Surgeon: Marylynn Pearson, MD;  Location: [facility];  Service: Gynecology;  Laterality: N/A;     Current  Outpatient Medications  Medication Sig Dispense Refill  . Ascorbic Acid (VITAMIN C PO) Take by mouth.    . cholecalciferol (VITAMIN D) 1000 units tablet Vitamin D3    . IRON PO Take by mouth.    . mometasone (NASONEX) 50 MCG/ACT nasal spray Place 2 sprays into the nose daily. ALLERGIES     No current facility-administered medications for this visit.     Allergies:   Pseudoephedrine hcl; Sulfonamide derivatives; Amoxicillin; Latex; Penicillins; and Sudafed [pseudoephedrine]   ROS:  Please see the history of present illness.   Otherwise, review of systems are positive for menorrhagia.   All other systems are reviewed and negative.    PHYSICAL EXAM: VS:  BP 132/72 (BP Location: Right Arm, Patient Position: Sitting)   Pulse 93   Ht 5\' 5"  (1.651 m)   Wt 224 lb 3.2 oz (101.7 kg)   BMI 37.31 kg/m  , BMI Body mass index is 37.31 kg/m. GENERAL:  Well appearing NECK:  No jugular venous distention, waveform within normal limits, carotid upstroke brisk and symmetric, no bruits, no thyromegaly LUNGS:  Clear to auscultation bilaterally CHEST:  Unremarkable HEART:  PMI is on the right side or sustained,S1 and S2 within normal limits, no S3, no S4, no clicks, no rubs,  no murmurs ABD:  Flat, positive bowel sounds normal in frequency in pitch, no bruits, no rebound, no guarding, no midline pulsatile mass, no hepatomegaly, no splenomegaly EXT:  2 plus pulses throughout, no edema, no cyanosis no clubbing    EKG:  EKG is ordered today. The ekg ordered today demonstrates sinus rhythm, rate 93, unusual P wave axis, short PR interval, lateral T wave.  This was right-sided leads for situs inversus.  This is unchanged from previous.   Recent Labs: No results found for requested labs within last 8760 hours.     Wt Readings from Last 3 Encounters:  04/12/18 224 lb 3.2 oz (101.7 kg)  04/03/17 228 lb (103.4 kg)  03/02/16 230 lb 6.4 oz (104.5 kg)      Other studies Reviewed: Additional studies/  records that were reviewed today include: Labs. Review of the above records demonstrates:  Please see elsewhere in the note.     ASSESSMENT AND PLAN:  SITUS INVERSUS: She is had no symptoms related to this.  She had a normal echo couple of years ago.  No change in therapy.  PALPITATIONS:  I would like for her to get an Alive Cor  OVERWEIGHT: I have given her specific instructions on exercise and I will be looking for a nutritionist as well.   Current medicines are reviewed at length with the patient today.  The patient does not have concerns regarding medicines.  The following changes have been made:  no change  Labs/ tests ordered today include:   Orders Placed This Encounter  Procedures  . EKG 12-Lead     Disposition:   FU with me in two years.      Signed, Minus Breeding, MD  04/12/2018 5:32 PM    [facility]

## 2018-04-12 ENCOUNTER — Encounter: Payer: Self-pay | Admitting: Cardiology

## 2018-04-12 ENCOUNTER — Encounter

## 2018-04-12 ENCOUNTER — Ambulatory Visit: Payer: BLUE CROSS/BLUE SHIELD | Admitting: Cardiology

## 2018-04-12 VITALS — BP 132/72 | HR 93 | Ht 65.0 in | Wt 224.2 lb

## 2018-04-12 DIAGNOSIS — R002 Palpitations: Secondary | ICD-10-CM

## 2018-04-12 DIAGNOSIS — Q249 Congenital malformation of heart, unspecified: Secondary | ICD-10-CM | POA: Diagnosis not present

## 2018-04-12 NOTE — Patient Instructions (Addendum)
Medication Instructions:  Continue current medications  If you need a refill on your cardiac medications before your next appointment, please call your pharmacy.  Labwork: None Ordered   Testing/Procedures: None Ordered  Special Instructions: TODD MEISINGER, OBGYN  : Your physician wants you to follow-up in: 2 Year. You should receive a reminder letter in the mail two months in advance. If you do not receive a letter, please call our office in [phone] to schedule your follow-up appointment.     Thank you for choosing [facility]!!        Address: [address]

## 2019-04-23 ENCOUNTER — Ambulatory Visit: Payer: BC Managed Care – PPO | Admitting: Neurology

## 2019-11-11 ENCOUNTER — Other Ambulatory Visit: Payer: Self-pay | Admitting: Obstetrics and Gynecology

## 2019-11-11 DIAGNOSIS — R928 Other abnormal and inconclusive findings on diagnostic imaging of breast: Secondary | ICD-10-CM

## 2019-11-14 ENCOUNTER — Ambulatory Visit
Admission: RE | Admit: 2019-11-14 | Discharge: 2019-11-14 | Disposition: A | Discharge status: Home / Self Care | Payer: BC Managed Care – PPO | Source: Ambulatory Visit | Attending: Obstetrics and Gynecology | Admitting: Obstetrics and Gynecology

## 2019-11-14 ENCOUNTER — Ambulatory Visit
Admission: RE | Admit: 2019-11-14 | Discharge: 2019-11-14 | Disposition: A | Discharge status: Home / Self Care | Payer: BLUE CROSS/BLUE SHIELD | Source: Ambulatory Visit | Attending: Obstetrics and Gynecology | Admitting: Obstetrics and Gynecology

## 2019-11-14 ENCOUNTER — Other Ambulatory Visit: Payer: Self-pay

## 2019-11-14 DIAGNOSIS — R928 Other abnormal and inconclusive findings on diagnostic imaging of breast: Secondary | ICD-10-CM

## 2019-12-18 ENCOUNTER — Encounter: Payer: BC Managed Care – PPO | Attending: Obstetrics and Gynecology | Admitting: Registered"

## 2019-12-18 DIAGNOSIS — E282 Polycystic ovarian syndrome: Secondary | ICD-10-CM

## 2019-12-20 ENCOUNTER — Encounter: Payer: Self-pay | Admitting: Registered"

## 2019-12-20 NOTE — Patient Instructions (Signed)
Consider diluting sweet tea with unsweet tea Avoid skipping breakfast, even something small may be helpful. Be sure to include protein Consider looking into inositol for PCOS symptoms Use snack sheet for ideas for balanced snacks

## 2019-12-20 NOTE — Progress Notes (Signed)
Visit completed via MyChart video. Patient identified by name and DOB.  Medical Nutrition Therapy:  Appt start time: 1000 end time:  1100.   Assessment:  Primary concerns today: PCOS and how it can affect future health, also would like to address weight management. Pt mentioned her last cholesterol labs were abnormal; ttl chol fine at 186 but HDL is 38.  PMH: hidradenitis suppurative; GDM,"strict" diet controlled 2010, 2015; Guillain Barre Syndrome; past history of Vit D deficiency  Pt states she request annual A1c due to increased risk of T2DM. Last lab was 5.2% Pt states for last 2 yrs her FBS is slightly over 100 mg/dL  Physical Activity: Pt reports residual pain in feet from Guillain Barre Syndrome. Pt states she still walks sometimes even though it is uncomfortable. Pt states she has symptoms in her arms when exposed to heat. Pt reports main barrier to P.A. is limited time in schedule for activity. Pt states her husband bought a stationery bike but she does not enjoy using it.  Weight management: Pt states she would like to feel good in skin and have more self-confidence. Pt states she felt her best at a weight closer to 175 lbs but states that is probably unrealistic goal, maybe less than 200 lbs is a goal she is after. Pt states the times she has had success with weight loss was when she was being extremely restrictive but states that is not sustainable.  Pt states she has had hidradenitis suppurativa for years, recently was prescribed spironolactone, but states she is careful when starting new medications and hasn't felt comfortable starting this one yet.  Preferred Learning Style:   No preference indicated   Learning Readiness:   Ready   MEDICATIONS: reviewed   DIETARY INTAKE:  Usual eating pattern includes 2 meals and 1 snacks per day. Pt states she is a picky eater.  24-hr recall:  B ( AM): usually too busy, may just grab a snack  Snk ( AM): none  L ( PM): chicken salad  on lettuce, fries, 12 oz sweet tea, water Snk ( PM): none D ( PM): spaghetti, meat sauce, french bread, water Snk ( PM): sweet or salty Beverages: water, sweet tea  Usual physical activity: ADLs  Estimated energy needs: Not assessed  Progress Towards Goal(s):  New goals.   Nutritional Diagnosis:  NB-1.1 Food and nutrition-related knowledge deficit As related to importance of protein at breakfast.  As evidenced by skipping breakfast .    Intervention:  Nutrition Education topics: PCOS and insulin resistance Protein needs Inositol Balanced Snacks.  Teaching Method Utilized:  Visual Auditory  Handouts sent after visit include:  Snack sheet  Inositol and PCOS  Barriers to learning/adherence to lifestyle change: none  Demonstrated degree of understanding via:  Teach Back   Monitoring/Evaluation:  Dietary intake, exercise, PCOS symptoms, and body weight in 6 week(s).

## 2020-01-25 ENCOUNTER — Other Ambulatory Visit: Payer: Self-pay

## 2020-01-25 ENCOUNTER — Encounter: Payer: Self-pay | Admitting: *Deleted

## 2020-01-25 ENCOUNTER — Ambulatory Visit
Admission: EM | Admit: 2020-01-25 | Discharge: 2020-01-25 | Disposition: A | Discharge status: Home / Self Care | Payer: BC Managed Care – PPO

## 2020-01-25 ENCOUNTER — Ambulatory Visit (INDEPENDENT_AMBULATORY_CARE_PROVIDER_SITE_OTHER)
Admission: RE | Admit: 2020-01-25 | Discharge: 2020-01-25 | Disposition: A | Discharge status: Other Acute Inpatient Hospital | Payer: BC Managed Care – PPO | Source: Ambulatory Visit

## 2020-01-25 DIAGNOSIS — M7989 Other specified soft tissue disorders: Secondary | ICD-10-CM

## 2020-01-25 DIAGNOSIS — M79674 Pain in right toe(s): Secondary | ICD-10-CM

## 2020-01-25 MED ORDER — NAPROXEN 500 MG PO TABS: 500.0000 mg | ORAL_TABLET | Freq: Two times a day (BID) | ORAL | 0 refills | Status: DC

## 2020-01-25 NOTE — ED Triage Notes (Addendum)
C/O tingling sensation and pressure with redness to right second toe since yesterday.  Denies fevers or known wounds to toe.

## 2020-01-25 NOTE — ED Provider Notes (Signed)
Virtual Visit via Video Note:  Susan Andrade  initiated request for Telemedicine visit with [facility] team. I connected with Susan Andrade  on 01/25/2020 at 12:29 PM  for a synchronized telemedicine visit using a video enabled HIPPA compliant telemedicine application. I verified that I am speaking with Susan Andrade  using two identifiers. Susan Eagles, PA-C  was physically located in a [facility] Urgent care site and Susan Andrade was located at a different location.   The limitations of evaluation and management by telemedicine as well as the availability of in-person appointments were discussed. Patient was informed that she  may incur a bill ( including co-pay) for this virtual visit encounter. Susan Andrade  expressed understanding and gave verbal consent to proceed with virtual visit.    History of Present Illness:Susan Andrade  is a 39 y.o. female presents with 1 day hx of stinging sensation of her right second toe. Noticed redness and inflammation over lateral side involving toenail. Denies trauma. Has a hx of ingrown toenails.    ROS  No current facility-administered medications for this encounter.   Current Outpatient Medications  Medication Sig Dispense Refill   Ascorbic Acid (VITAMIN C PO) Take by mouth.     cholecalciferol (VITAMIN D) 1000 units tablet Vitamin D3     IRON PO Take by mouth.     mometasone (NASONEX) 50 MCG/ACT nasal spray Place 2 sprays into the nose daily. ALLERGIES       Allergies  Allergen Reactions   Pseudoephedrine Hcl Palpitations   Sulfonamide Derivatives Other (See Comments)    Family history of rxn; pt does not want to take   Amoxicillin Hives and Rash   Latex Rash   Penicillins Hives and Rash   Sudafed [Pseudoephedrine] Palpitations     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[facility])    Hyperglycemia    Leukocytoclastic vasculitis ([facility])     Polycystic ovary disease    Retinal hemorrhage    Sinusitis-bronchiectasis-situs inversus syndrome    Situs inversus totalis    diagnosed when 39 years old    Past Surgical History:  Procedure Laterality Date   CESAREAN SECTION     2010   DILATION AND EVACUATION N/A 10/10/2012   Procedure: DILATATION AND EVACUATION;  Surgeon: Cyril Mourning, MD;  Location: [facility];  Service: Gynecology;  Laterality: N/A;   DILATION AND EVACUATION N/A 10/13/2012   Procedure: DILATATION AND EVACUATION UNDER ULTRASOUND GUIDENCE;  Surgeon: Marylynn Pearson, MD;  Location: [facility];  Service: Gynecology;  Laterality: N/A;      Observations/Objective: Physical Exam Constitutional:      General: She is not in acute distress.    Appearance: Normal appearance. She is well-developed. She is not ill-appearing, toxic-appearing or diaphoretic.  Eyes:     Extraocular Movements: Extraocular movements intact.  Pulmonary:     Effort: Pulmonary effort is normal.  Musculoskeletal:       Legs:  Neurological:     General: No focal deficit present.     Mental Status: She is alert and oriented to person, place, and time.  Psychiatric:        Mood and Affect: Mood normal.        Behavior: Behavior normal.        Thought Content: Thought content normal.        Judgment: Judgment normal.  Assessment and Plan:  PDMP not reviewed this encounter.  1. Toe pain, right   2. Pain and swelling of toe of right foot    Recommended in person evaluation for ingrown toenail versus paronychia. Use naproxen for pain and inflammation otherwise. Patient plans on coming in for an evaluation.    Follow Up Instructions:    I discussed the assessment and treatment plan with the patient. The patient was provided an opportunity to ask questions and all were answered. The patient agreed with the plan and demonstrated an understanding of the instructions.   The patient was advised to call back or seek an in-person  evaluation if the symptoms worsen or if the condition fails to improve as anticipated.  I provided 10 minutes of non-face-to-face time during this encounter.    Susan Eagles, PA-C  01/25/2020 12:29 PM         Susan Eagles, PA-C 01/25/20 1243

## 2020-01-25 NOTE — Discharge Instructions (Signed)
Take naproxen twice daily with food. May do Epson salt soaks as discussed. Recommend wearing corn pads to help alleviate pressure. Follow-up with podiatry for persistent or worsening symptoms. Return in the interim for worsening pain, swelling, warmth, discharge, fever.

## 2020-01-25 NOTE — ED Provider Notes (Signed)
[facility]    CSN: [id number] Arrival date & time: 01/25/20  1452      History   Chief Complaint Chief Complaint  Patient presents with  . Toe Pain    HPI Susan Andrade is a 39 y.o. 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  Patient noticed redness and inflammation.  Denies trauma, significant pain, discharge.  Has not tried thing for this.    Past Medical History:  Diagnosis Date  . DM (diabetes mellitus) in pregnancy   . 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 Hyperglycemia   . Leukocytoclastic vasculitis ([place])   . Polycystic ovary disease   . Retinal hemorrhage   . Sinusitis-bronchiectasis-situs inversus syndrome   . 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 Sinus tachycardia 07/08/2014  . Pregnancy 03/25/2014  . VBAC, delivered 03/25/2014  . Sinusitis, acute 10/20/2011  . Obesity 08/01/2011  . History of PCOS 08/01/2011  . DM (diabetes mellitus), gestational, delivered 08/01/2011  . Allergic rhinitis due to pollen 12/20/2010  . PALPITATIONS 05/14/2010  . 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 ALLERGIC RHINITIS 10/06/2008  . CYSTIC ACNE 10/06/2008  . SEBACEOUS CYST, SCALP 10/10/2007  . LIPOMA, SKIN 07/05/2007  . SITUS INVERSUS 07/05/2007  . MUSCLE FASCICULATIONS 07/05/2007  . CHEST WALL PAIN, ANTERIOR 07/05/2007  . VIRAL URI 06/04/2007    Past Surgical History:  Procedure Laterality Date  . CESAREAN SECTION     2010  . DILATION AND EVACUATION N/A 10/10/2012   Procedure: DILATATION AND EVACUATION;  Surgeon: Cyril Mourning, MD;  Location: [facility];  Service: Gynecology;  Laterality: N/A;  . DILATION AND EVACUATION N/A 10/13/2012   Procedure: DILATATION AND EVACUATION UNDER ULTRASOUND GUIDENCE;  Surgeon: Marylynn Pearson, MD;  Location: [facility];  Service: Gynecology;   Laterality: N/A;    OB History    Gravida  3   Para  2   Term  2   Preterm  0   AB  1   Living  2     SAB  1   TAB  0   Ectopic  0   Multiple  0   Live Births  2            Home Medications    Prior to Admission medications   Medication Sig Start Date End Date Taking? Authorizing Provider  Ascorbic Acid (VITAMIN C PO) Take by mouth.   Yes [provider]  azelastine (ASTELIN) 0.1 % nasal spray Place into both nostrils 2 (two) times daily. Use in each nostril as directed   Yes [provider]  FISH OIL-VITAMIN D PO Take by mouth.   Yes [provider]  Probiotic Product (PROBIOTIC PO) Take by mouth.   Yes [provider]  cholecalciferol (VITAMIN D) 1000 units tablet Vitamin D3    [provider]  IRON PO Take by mouth.    [provider]  mometasone (NASONEX) 50 MCG/ACT nasal spray Place 2 sprays into the nose daily. ALLERGIES 07/09/15 07/08/16  [provider]  naproxen (NAPROSYN) 500 MG tablet Take 1 tablet (500 mg total) by mouth 2 (two) times daily with a meal. 01/25/20   Jaynee Eagles, PA-C    Family History Family History  Problem Relation Age of Onset  .  Pancreatic cancer Father 23  . Melanoma Father        dx in early 70s  . Arthritis Mother   . Aneurysm Maternal Grandmother 80       Cerebral  . Coronary artery disease Maternal Grandfather 50  . Heart attack Neg Hx     Social History Social History   Tobacco Use  . Smoking status: Never Smoker  . Smokeless tobacco: Never Used  Substance Use Topics  . Alcohol use: Yes    Comment: rarely  . Drug use: No     Allergies   Pseudoephedrine hcl, Sulfonamide derivatives, Amoxicillin, Latex, Penicillins, and Sudafed [pseudoephedrine]   Review of Systems As per HPI   Physical Exam Triage Vital Signs ED Triage Vitals  Enc Vitals Group     BP 01/25/20 1503 140/89     Pulse Rate 01/25/20 1503 (!) 111     Resp 01/25/20 1503 16      Temp 01/25/20 1503 98.4 F (36.9 C)     Temp Source 01/25/20 1503 Oral     SpO2 01/25/20 1503 97 %     Weight --      Height --      Head Circumference --      Peak Flow --      Pain Score 01/25/20 1504 2     Pain Loc --      Pain Edu? --      Excl. in [place]? --    No data found.  Updated Vital Signs BP 140/89   Pulse (!) 111   Temp 98.4 F (36.9 C) (Oral)   Resp 16   LMP 01/25/2020 (Exact Date)   SpO2 97%   Breastfeeding No   Visual Acuity Right Eye Distance:   Left Eye Distance:   Bilateral Distance:    Right Eye Near:   Left Eye Near:    Bilateral Near:     Physical Exam Constitutional:      General: She is not in acute distress. HENT:     Head: Normocephalic and atraumatic.  Eyes:     General: No scleral icterus.    Pupils: Pupils are equal, round, and reactive to light.  Cardiovascular:     Rate and Rhythm: Normal rate.  Pulmonary:     Effort: Pulmonary effort is normal.  Musculoskeletal:        General: Swelling and tenderness present. Normal range of motion.     Comments: Mild TTP without crepitus  Skin:    Coloration: Skin is not jaundiced or pale.     Comments: Rubor noted to lateral aspect of right second toe.  No bruising, warmth, fluctuance.  No ingrown toenail.  Neurological:     Mental Status: She is alert and oriented to person, place, and time.      UC Treatments / Results  Labs (all labs ordered are listed, but only abnormal results are displayed) Labs Reviewed - No data to display  EKG   Radiology No results found.  Procedures Procedures (including critical care time)  Medications Ordered in UC Medications - No data to display  Initial Impression / Assessment and Plan / UC Course  I have reviewed the triage vital signs and the nursing notes.  Pertinent labs & imaging results that were available during my care of the patient were reviewed by me and considered in my medical decision making (see chart for details).      Patient febrile, nontoxic in office today.  Exam largely  benign: Low concern for cellulitis, paronychia, ingrown toenail.  Provided contact information for podiatry for follow-up if needed.  Will take naproxen, Epson salt soaks, corn pads to help reduce irritation.  Return precautions discussed, patient verbalized understanding and is agreeable to plan. Final Clinical Impressions(s) / UC Diagnoses   Final diagnoses:  Pain and swelling of toe of right foot     Discharge Instructions     Take naproxen twice daily with food. May do Epson salt soaks as discussed. Recommend wearing corn pads to help alleviate pressure. Follow-up with podiatry for persistent or worsening symptoms. Return in the interim for worsening pain, swelling, warmth, discharge, fever.    ED Prescriptions    None     PDMP not reviewed this encounter.   [place], [place], [place] 01/25/20 [phone]

## 2020-02-07 ENCOUNTER — Encounter: Payer: BC Managed Care – PPO | Attending: Obstetrics and Gynecology | Admitting: Registered"

## 2020-02-07 DIAGNOSIS — E282 Polycystic ovarian syndrome: Secondary | ICD-10-CM | POA: Insufficient documentation

## 2020-04-30 ENCOUNTER — Other Ambulatory Visit: Payer: Self-pay

## 2020-05-01 ENCOUNTER — Other Ambulatory Visit: Payer: BC Managed Care – PPO

## 2020-06-23 IMAGING — US US BREAST*L* LIMITED INC AXILLA
1 series · 3 of 3 positions shown · non-contrast
Comparison: Previous exams including recent screening mammogram
dated 11/07/2019.

CLINICAL DATA: Patient returns today to evaluate possible bilateral
breast asymmetries questioned on recent screening mammogram.

EXAM:
DIGITAL DIAGNOSTIC BILATERAL MAMMOGRAM WITH CAD AND TOMO
ULTRASOUND BILATERAL BREAST

[Series 1: us breast*left* limited inc axilla · 0.07mm/px · 3 of 3 slices shown]
[im 1/3]
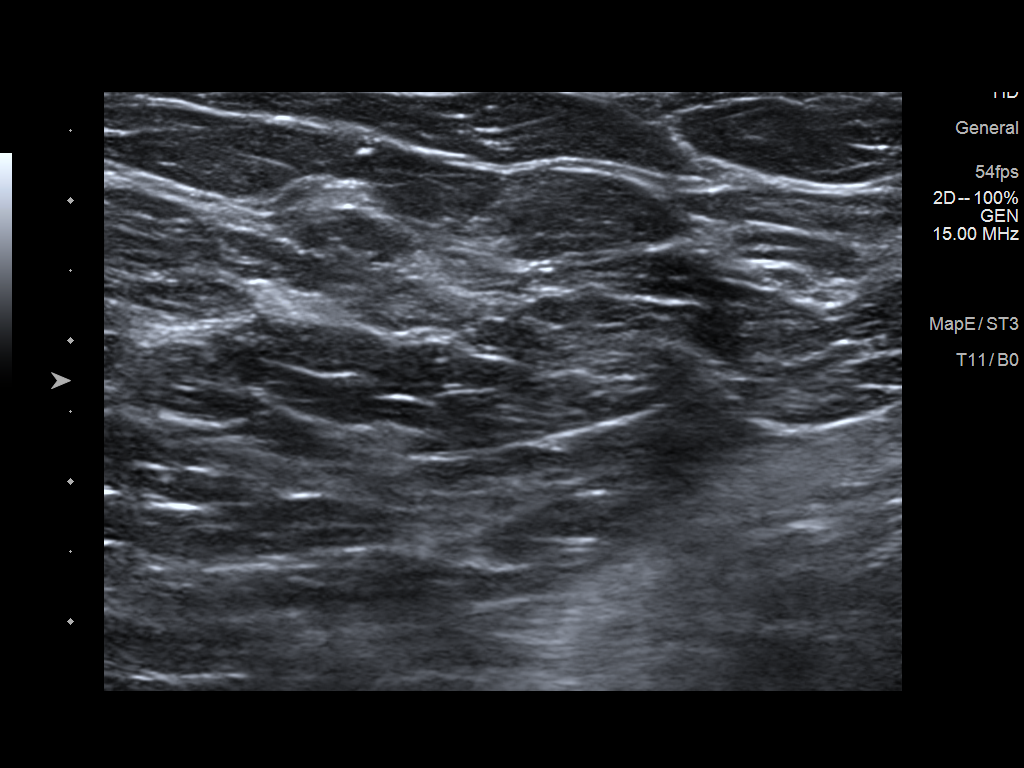
[im 2/3]
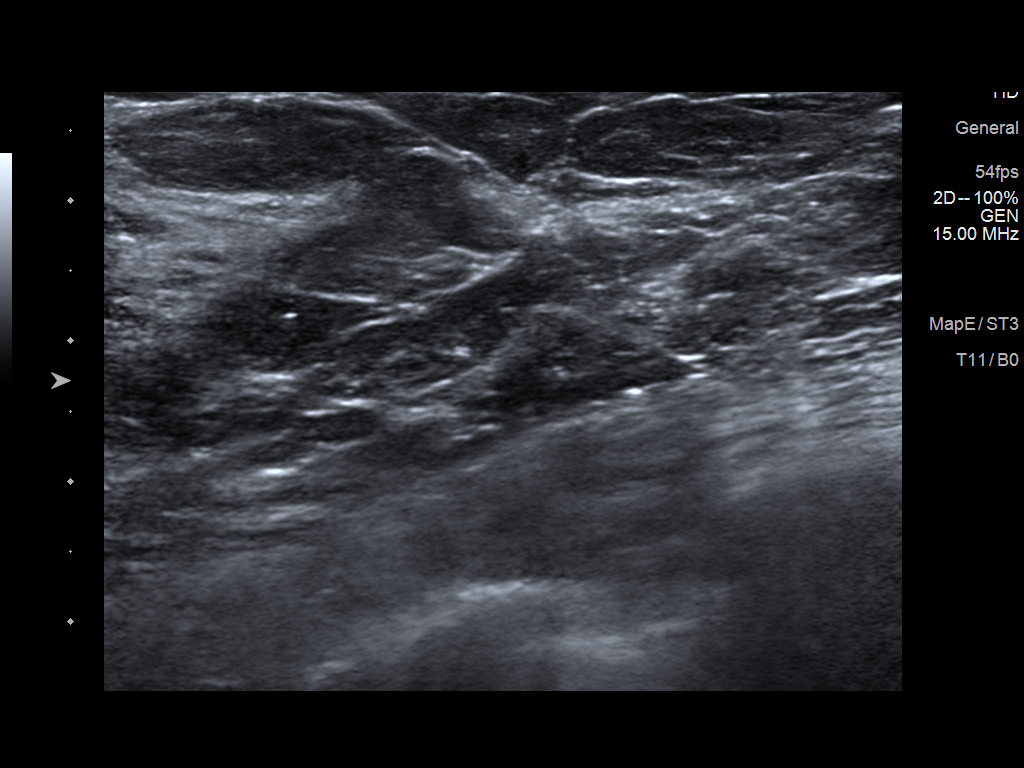
[im 3/3]
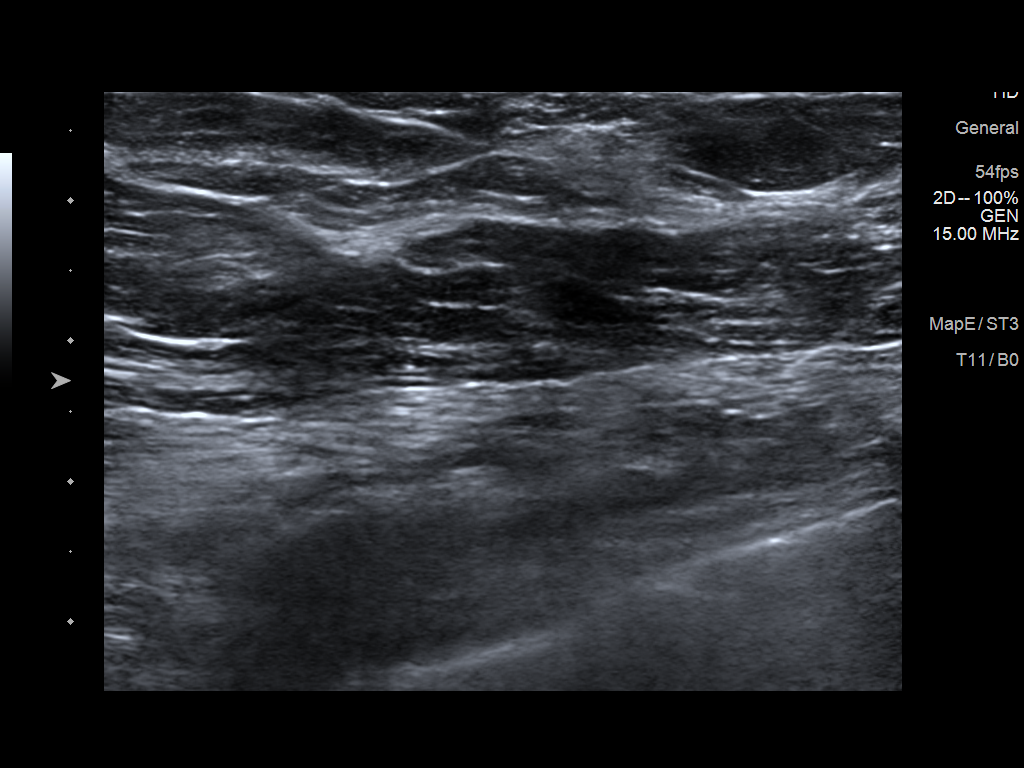

[3 of 3 positions shown; findings below may reference images not displayed]

ACR Breast Density Category c: The breast tissue is heterogeneously
dense, which may obscure small masses.
FINDINGS: RIGHT breast: On today's additional diagnostic views, there is no
persistent asymmetry within the upper RIGHT breast indicating
superimposition of normal dense fibroglandular tissues.

LEFT breast: On today's additional diagnostic views, there is no
persistent asymmetry within the lower inner quadrant of the LEFT
breast indicating superimposition of normal dense fibroglandular
tissues.

Due to the heterogeneously dense breast tissues, bilateral breast
ultrasound will be performed to ensure benignity.

Mammographic images were processed with CAD.

RIGHT breast: Targeted ultrasound is performed, evaluating the
entire upper RIGHT breast, showing only normal fibroglandular
tissues and fat lobules throughout. No solid or cystic mass.

LEFT breast: Targeted ultrasound is performed, evaluating the entire
lower LEFT breast, showing only normal fibroglandular tissues and
fat lobules throughout. No solid or cystic mass.
IMPRESSION: No evidence of malignancy within either breast.

RECOMMENDATION:
1.  Screening mammogram in one year.(Code:[id number])
2. Given patient's family history of cancer and patient's dense
breasts, would consider the addition of annual screening breast MRI
to annual screening mammography. Per American Cancer Society
guidelines, annual screening MRI of the breasts is recommended if a
risk assessment calculation for breast cancer, preferably using the
Tyrer-Cuzick model, measures greater than 20%. If the calculation is
less than 20%, patient would be a candidate for abbreviated breast
MRI and patient was provided with a pamphlet explaining the pros and
cons of abbreviated breast MRI.

I have discussed the findings and recommendations with the patient.
If applicable, a reminder letter will be sent to the patient
regarding the next appointment.

BI-RADS CATEGORY  1: Negative.

## 2020-07-02 ENCOUNTER — Telehealth: Payer: Self-pay | Admitting: Cardiology

## 2020-07-02 ENCOUNTER — Other Ambulatory Visit: Payer: Self-pay

## 2020-07-02 ENCOUNTER — Emergency Department (HOSPITAL_BASED_OUTPATIENT_CLINIC_OR_DEPARTMENT_OTHER): Payer: BC Managed Care – PPO

## 2020-07-02 ENCOUNTER — Emergency Department (HOSPITAL_BASED_OUTPATIENT_CLINIC_OR_DEPARTMENT_OTHER)
Admission: EM | Admit: 2020-07-02 | Discharge: 2020-07-03 | Disposition: A | Discharge status: Home / Self Care | Payer: BC Managed Care – PPO | Attending: Emergency Medicine | Admitting: Emergency Medicine

## 2020-07-02 ENCOUNTER — Encounter (HOSPITAL_BASED_OUTPATIENT_CLINIC_OR_DEPARTMENT_OTHER): Payer: Self-pay | Admitting: *Deleted

## 2020-07-02 DIAGNOSIS — Z91040 Latex allergy status: Secondary | ICD-10-CM | POA: Insufficient documentation

## 2020-07-02 DIAGNOSIS — R42 Dizziness and giddiness: Secondary | ICD-10-CM | POA: Diagnosis not present

## 2020-07-02 DIAGNOSIS — R002 Palpitations: Secondary | ICD-10-CM | POA: Insufficient documentation

## 2020-07-02 LAB — CBC WITH DIFFERENTIAL/PLATELET
Abs Immature Granulocytes: 0.1 10*3/uL — ABNORMAL HIGH (ref 0.00–0.07)
Basophils Absolute: 0.1 10*3/uL (ref 0.0–0.1)
Basophils Relative: 1 %
Eosinophils Absolute: 0 10*3/uL (ref 0.0–0.5)
Eosinophils Relative: 0 %
HCT: 38.6 % (ref 36.0–46.0)
Hemoglobin: 12.1 g/dL (ref 12.0–15.0)
Immature Granulocytes: 1 %
Lymphocytes Relative: 26 %
Lymphs Abs: 3.3 10*3/uL (ref 0.7–4.0)
MCH: 25.5 pg — ABNORMAL LOW (ref 26.0–34.0)
MCHC: 31.3 g/dL (ref 30.0–36.0)
MCV: 81.3 fL (ref 80.0–100.0)
Monocytes Absolute: 0.8 10*3/uL (ref 0.1–1.0)
Monocytes Relative: 6 %
Neutro Abs: 8.2 10*3/uL — ABNORMAL HIGH (ref 1.7–7.7)
Neutrophils Relative %: 66 %
Platelets: 324 10*3/uL (ref 150–400)
RBC: 4.75 MIL/uL (ref 3.87–5.11)
RDW: 13.7 % (ref 11.5–15.5)
WBC: 12.5 10*3/uL — ABNORMAL HIGH (ref 4.0–10.5)
nRBC: 0 % (ref 0.0–0.2)

## 2020-07-02 LAB — COMPREHENSIVE METABOLIC PANEL
ALT: 18 U/L (ref 0–44)
AST: 17 U/L (ref 15–41)
Albumin: 3.9 g/dL (ref 3.5–5.0)
Alkaline Phosphatase: 59 U/L (ref 38–126)
Anion gap: 10 (ref 5–15)
BUN: 14 mg/dL (ref 6–20)
CO2: 24 mmol/L (ref 22–32)
Calcium: 8.7 mg/dL — ABNORMAL LOW (ref 8.9–10.3)
Chloride: 105 mmol/L (ref 98–111)
Creatinine, Ser: 0.6 mg/dL (ref 0.44–1.00)
GFR, Estimated: >60 mL/min (ref >60)
Glucose, Bld: 95 mg/dL (ref 70–99)
Potassium: 4.2 mmol/L (ref 3.5–5.1)
Sodium: 139 mmol/L (ref 135–145)
Total Bilirubin: 0.3 mg/dL (ref 0.3–1.2)
Total Protein: 6.8 g/dL (ref 6.5–8.1)

## 2020-07-02 LAB — PREGNANCY, URINE: Preg Test, Ur: NEGATIVE

## 2020-07-02 LAB — TROPONIN I (HIGH SENSITIVITY): Troponin I (High Sensitivity): 6 ng/L (ref <18)

## 2020-07-02 NOTE — ED Notes (Signed)
Placed pt in a gown and given a warm blanket. Pt also hooked up to the monitor with 5 lead, BP cuff and pulse ox

## 2020-07-02 NOTE — Telephone Encounter (Signed)
Spoke to patient. She states  She has a heart rate of  170's lasting to for about  45 to hour. She has not taken  Blood pressure  . She has record information - alive acor   not able to send recording.   Patient is not on any cardiac medication .  RN asked  Patient if she  Attempted in  Vagal   - like bearing down.,  Drinking  Ice cold water, cough or splashing cold water.  recommendation is to go to ER. Patient states she will not go- because she  Immunocompromise  - auto  Immune  Issue.  - she  Started Prednisone recently  For neck issue.  patient wanted to know if  RN can review with a doctor.  RN discuss with doctor  - Dr Debara Pickett . Recommend  For to go to Er . If she does not go - calling  EMS - they can evaluate if necessary give appropriate medication and she may not need to go to hospital   Patient is aware .   Patient inquired why she could not come to office now. RnN informed her the type of treatment she may need require  ER visit

## 2020-07-02 NOTE — Telephone Encounter (Signed)
Patient c/o Palpitations:  High priority if patient c/o lightheadedness, shortness of breath, or chest pain  1) How long have you had palpitations/irregular HR/ Afib? Are you having the symptoms now? They stared today and yes   2) Are you currently experiencing lightheadedness, SOB or CP? lightheaded SOB a little  3) Do you have a history of afib (atrial fibrillation) or irregular heart rhythm? Mild   Have you checked your BP or HR? (document readings if available): HR 173 4) Are you experiencing any other symptoms? no

## 2020-07-02 NOTE — ED Triage Notes (Signed)
Chest pain. She has had a heart rate of 170 at home. Hx of situs inversus totalis. No pain now but feels like her heart is beating out of her chest.

## 2020-07-03 ENCOUNTER — Telehealth: Payer: Self-pay | Admitting: Cardiology

## 2020-07-03 LAB — TROPONIN I (HIGH SENSITIVITY): Troponin I (High Sensitivity): 8 ng/L (ref <18)

## 2020-07-03 NOTE — Discharge Instructions (Addendum)
Continue medications as previously prescribed.  Return to the emergency department if you experience worsening chest pain, recurrent palpitations, difficulty breathing, or other new and concerning symptoms.  You should follow-up with your cardiologist in the next few days.

## 2020-07-03 NOTE — ED Provider Notes (Signed)
[facility] DEPART[facility] Provider Note   CSN: [id number] Arrival date & time: 07/02/20  2112     History Chief Complaint  Patient presents with  . Chest Pain    Susan Andrade is a 39 y.o. female.  Patient is a 39 year old female with past medical history of situs inversus totalis, polycystic ovaries, Guyon Barr, gestational diabetes.  She presents today for evaluation of palpitations and lightheadedness.  This started approximately 3 PM today.  She describes feeling a rapid heartbeat at home to 170.  Patient has a Research scientist (medical) at home which read possible A. fib.  She called her cardiology's office, however was late in the day and was unable to get a hold of him.  She presents here for evaluation.  Her heart rate has improved after arriving here and she is feeling better.  The history is provided by the patient.       Past Medical History:  Diagnosis Date  . DM (diabetes mellitus) in pregnancy   . 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 Hyperglycemia   . Leukocytoclastic vasculitis ([place])   . Polycystic ovary disease   . Retinal hemorrhage   . Sinusitis-bronchiectasis-situs inversus syndrome   . 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 Sinus tachycardia 07/08/2014  . Pregnancy 03/25/2014  . VBAC, delivered 03/25/2014  . Sinusitis, acute 10/20/2011  . Obesity 08/01/2011  . History of PCOS 08/01/2011  . DM (diabetes mellitus), gestational, delivered 08/01/2011  . Allergic rhinitis due to pollen 12/20/2010  . PALPITATIONS 05/14/2010  . 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 ALLERGIC RHINITIS 10/06/2008  . CYSTIC ACNE 10/06/2008  . SEBACEOUS CYST, SCALP 10/10/2007  . LIPOMA, SKIN 07/05/2007  . SITUS INVERSUS 07/05/2007  . MUSCLE FASCICULATIONS 07/05/2007  . CHEST WALL PAIN, ANTERIOR 07/05/2007  . VIRAL URI 06/04/2007    Past Surgical History:  Procedure Laterality Date    . CESAREAN SECTION     2010  . DILATION AND EVACUATION N/A 10/10/2012   Procedure: DILATATION AND EVACUATION;  Surgeon: Cyril Mourning, MD;  Location: [facility];  Service: Gynecology;  Laterality: N/A;  . DILATION AND EVACUATION N/A 10/13/2012   Procedure: DILATATION AND EVACUATION UNDER ULTRASOUND GUIDENCE;  Surgeon: Marylynn Pearson, MD;  Location: [facility];  Service: Gynecology;  Laterality: N/A;     OB History    Gravida  3   Para  2   Term  2   Preterm  0   AB  1   Living  2     SAB  1   TAB  0   Ectopic  0   Multiple  0   Live Births  2           Family History  Problem Relation Age of Onset  . Pancreatic cancer Father 18  . Melanoma Father        dx in early 23s  . Arthritis Mother   . Aneurysm Maternal Grandmother 80       Cerebral  . Coronary artery disease Maternal Grandfather 50  . Heart attack Neg Hx     Social History   Tobacco Use  . Smoking status: Never Smoker  . Smokeless tobacco: Never Used  Vaping Use  . Vaping Use: Never used  Substance Use Topics  . Alcohol use: Yes    Comment: rarely  . Drug use: No  Home Medications Prior to Admission medications   Medication Sig Start Date End Date Taking? Authorizing Provider  Ascorbic Acid (VITAMIN C PO) Take by mouth.    [provider]  azelastine (ASTELIN) 0.1 % nasal spray Place into both nostrils 2 (two) times daily. Use in each nostril as directed    [provider]  cholecalciferol (VITAMIN D) 1000 units tablet Vitamin D3    [provider]  FISH OIL-VITAMIN D PO Take by mouth.    [provider]  IRON PO Take by mouth.    [provider]  mometasone (NASONEX) 50 MCG/ACT nasal spray Place 2 sprays into the nose daily. ALLERGIES 07/09/15 07/08/16  [provider]  naproxen (NAPROSYN) 500 MG tablet Take 1 tablet (500 mg total) by mouth 2 (two) times daily with a meal. 01/25/20   Jaynee Eagles, PA-C  Probiotic Product (PROBIOTIC PO)  Take by mouth.    [provider]    Allergies    Pseudoephedrine hcl, Sulfonamide derivatives, Amoxicillin, Latex, Penicillins, and Sudafed [pseudoephedrine]  Review of Systems   Review of Systems  All other systems reviewed and are negative.   Physical Exam Updated Vital Signs BP 117/76   Pulse 99   Temp 98.1 F (36.7 C) (Oral)   Resp (!) 21   Ht 5\' 5"  (1.651 m)   Wt 102.1 kg   LMP 06/30/2020   SpO2 98%   BMI 37.44 kg/m   Physical Exam Vitals and nursing note reviewed.  Constitutional:      General: She is not in acute distress.    Appearance: She is well-developed. She is not diaphoretic.  HENT:     Head: Normocephalic and atraumatic.  Cardiovascular:     Rate and Rhythm: Normal rate and regular rhythm.     Heart sounds: No murmur heard.  No friction rub. No gallop.   Pulmonary:     Effort: Pulmonary effort is normal. No respiratory distress.     Breath sounds: Normal breath sounds. No wheezing.  Abdominal:     General: Bowel sounds are normal. There is no distension.     Palpations: Abdomen is soft.     Tenderness: There is no abdominal tenderness.  Musculoskeletal:        General: Normal range of motion.     Cervical back: Normal range of motion and neck supple.     Right lower leg: No tenderness. No edema.     Left lower leg: No tenderness. No edema.  Skin:    General: Skin is warm and dry.  Neurological:     Mental Status: She is alert and oriented to person, place, and time.     ED Results / Procedures / Treatments   Labs (all labs ordered are listed, but only abnormal results are displayed) Labs Reviewed  CBC WITH DIFFERENTIAL/PLATELET - Abnormal; Notable for the following components:      Result Value   WBC 12.5 (*)    MCH 25.5 (*)    Neutro Abs 8.2 (*)    Abs Immature Granulocytes 0.10 (*)    All other components within normal limits  COMPREHENSIVE METABOLIC PANEL - Abnormal; Notable for the following components:   Calcium 8.7 (*)     All other components within normal limits  PREGNANCY, URINE  TROPONIN I (HIGH SENSITIVITY)  TROPONIN I (HIGH SENSITIVITY)    EKG EKG Interpretation  Date/Time:  Thursday July 02 2020 21:22:20 EST Ventricular Rate:  127 PR Interval:  128 QRS  Duration: 82 QT Interval:  292 QTC Calculation: 424 R Axis:   -143 Text Interpretation: Sinus tachycardia Lateral infarct , age undetermined Abnormal ECG No significant change since 03/02/2014 Confirmed by Veryl Speak [phone]) on 07/03/2020 12:10:38 AM   Radiology DG Chest Portable 1 View  Result Date: 07/02/2020 CLINICAL DATA:  39 year old female with tachycardia. EXAM: PORTABLE CHEST 1 VIEW COMPARISON:  None. FINDINGS: The lungs are clear. There is no pleural effusion pneumothorax. There is dextrocardia. No acute osseous pathology. IMPRESSION: No active disease. Electronically Signed   By: Anner Crete M.D.   On: 07/02/2020 21:50    Procedures Procedures (including critical care time)  Medications Ordered in ED Medications - No data to display  ED Course  I have reviewed the triage vital signs and the nursing notes.  Pertinent labs & imaging results that were available during my care of the patient were reviewed by me and considered in my medical decision making (see chart for details).    MDM Rules/Calculators/A&P  Patient presents here with complaints of palpitations and lightheadedness.  She has history of situs inversus and has seen a cardiologist in the past.  She is followed by Dr. Percival Spanish.  Patient arrives here feeling somewhat better.  Her work-up shows stable vital signs with unchanged EKG.  Her laboratory studies are basically unremarkable including troponin x2.  She has been observed for several hours in the ER and I feel as though discharge is appropriate.  I doubt an acute coronary event and also doubt pulmonary embolism.  Patient to be followed up by cardiology in the next few days and to return to the ER in  the meantime if her symptoms worsen or change.  Final Clinical Impression(s) / ED Diagnoses Final diagnoses:  None    Rx / DC Orders ED Discharge Orders    None       Veryl Speak, MD 07/03/20 [id number]

## 2020-07-03 NOTE — Telephone Encounter (Signed)
Returned call to pt she states that she need an appointment today and further directions should this happen again(pounding HR, jaw pain and dizziness). Informed pt that Dr Percival Spanish is not in the office today, he is at the hospital.   Informed pt that if this should happen again have someone take her to the ED. Verbalizes understanding.  Made appt w/ Dr Percival Spanish 11-16 @4pm , she would like message forwarded to Dr Percival Spanish for further direction/comments.

## 2020-07-03 NOTE — Telephone Encounter (Signed)
    Pt said she went to [facility] ED last night due to pounding HR, jaw pain and dizziness. She would like to know if she can see Dr. Percival Spanish or APP today or any recommendation what she needs to do

## 2020-07-04 NOTE — Telephone Encounter (Signed)
Agree with appt this week and other recommendations.

## 2020-07-05 NOTE — Progress Notes (Signed)
Cardiology Office Note   Date:  07/07/2020   ID:  Shyrl, Obi 28-Jan-1981, MRN [id number]  PCP:  Chesley Noon, MD  Cardiologist:   No primary care provider on file.  Chief Complaint  Patient presents with   Palpitations      History of Present Illness: Susan Andrade is a 39 y.o. female who presents for follow up of  situs inversus totalis. I have not seen her since 2015 .  She was last seen in 2017 and had a pulsatile feeling in her neck but there was no clear reason for this.  She had a follow up CT.   She had a normal echo aside from the situs inversus.      She called recently with palpitations.  She has been dealing with some neck pain and seein from spine specialist.  She had to be treated with prednisone and muscle relaxant.  She had to have a couple of courses of this and on the second course she developed a red hot arm.  The cause of this was not clear.  She subsequently had discomfort in her jaw.  She was driving with her son.  She felt her heart beating fast.  She had some lightheadedness.  She had to take deep breaths.  She eventually had some resolution.  However, she has had tachypalpitations again at home and did not record on her Alive Cor some rapid rates in the 160s to 170s.  This appears to be atrial fibrillation.  He went to the emergency room and I reviewed these records.  There were no significant abnormalities.   She was in sinus tachycardia.  Otherwise she had been doing relatively well.  Not been getting any new chest pressure, neck or arm discomfort.  She is not had any presyncope or syncope.  She has had no new shortness of breath, PND or orthopnea.  She is a mother of 2 young boys and so is active in this respect.  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  Her husband is a Theatre stage manager.     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[place])    Hyperglycemia    Leukocytoclastic  vasculitis (HCC)    Polycystic ovary disease    Retinal hemorrhage    Sinusitis-bronchiectasis-situs inversus syndrome    Situs inversus totalis    diagnosed when 39 years old    Past Surgical History:  Procedure Laterality Date   CESAREAN SECTION     2010   DILATION AND EVACUATION N/A 10/10/2012   Procedure: DILATATION AND EVACUATION;  Surgeon: Cyril Mourning, MD;  Location: [facility];  Service: Gynecology;  Laterality: N/A;   DILATION AND EVACUATION N/A 10/13/2012   Procedure: DILATATION AND EVACUATION UNDER ULTRASOUND GUIDENCE;  Surgeon: Marylynn Pearson, MD;  Location: [facility];  Service: Gynecology;  Laterality: N/A;     Current Outpatient Medications  Medication Sig Dispense Refill   Ascorbic Acid (VITAMIN C PO) Take by mouth.     azelastine (ASTELIN) 0.1 % nasal spray Place into both nostrils 2 (two) times daily. Use in each nostril as directed     cholecalciferol (VITAMIN D) 1000 units tablet Vitamin D3     FISH OIL-VITAMIN D PO Take by mouth.     Probiotic Product (PROBIOTIC PO) Take by mouth.     metoprolol tartrate (LOPRESSOR) 25 MG tablet Take 0.5 tablets (12.5  mg total) by mouth daily as needed (Palpitations). 30 tablet 2   mometasone (NASONEX) 50 MCG/ACT nasal spray Place 2 sprays into the nose daily. ALLERGIES     No current facility-administered medications for this visit.    Allergies:   Pseudoephedrine hcl, Sulfonamide derivatives, Amoxicillin, Latex, Penicillins, and Sudafed [pseudoephedrine]   ROS:  Please see the history of present illness.   Otherwise, review of systems are positive for none.   All other systems are reviewed and negative.    PHYSICAL EXAM: VS:  BP 123/74    Pulse (!) 114    Temp 98.1 F (36.7 C)    Ht 5\' 5"  (1.651 m)    Wt 230 lb 6.4 oz (104.5 kg)    LMP 06/30/2020    BMI 38.34 kg/m  , BMI Body mass index is 38.34 kg/m. GENERAL:  Well appearing NECK:  No jugular venous distention, waveform within normal limits, carotid upstroke  brisk and symmetric, no bruits, no thyromegaly LUNGS:  Clear to auscultation bilaterally CHEST:  Unremarkable HEART:  PMI not displaced or sustained,S1 and S2 within normal limits, no S3, no S4, no clicks, no rubs, no murmurs ABD:  Flat, positive bowel sounds normal in frequency in pitch, no bruits, no rebound, no guarding, no midline pulsatile mass, no hepatomegaly, no splenomegaly EXT:  2 plus pulses throughout, no edema, no cyanosis no clubbing      EKG:  EKG is  ordered today. The ekg ordered today demonstrates sinus rhythm, rate 93, unusual P wave axis, short PR interval, lateral T wave.  This was right-sided leads for situs inversus.  This is unchanged from previous.   Recent Labs: 07/02/2020: ALT 18; BUN 14; Creatinine, Ser 0.60; Hemoglobin 12.1; Platelets 324; Potassium 4.2; Sodium 139     Wt Readings from Last 3 Encounters:  07/07/20 230 lb 6.4 oz (104.5 kg)  07/02/20 225 lb (102.1 kg)  04/12/18 224 lb 3.2 oz (101.7 kg)      Other studies Reviewed: Additional studies/ records that were reviewed today include: Labs. Review of the above records demonstrates:  Please see elsewhere in the note.     ASSESSMENT AND PLAN:  SITUS INVERSUS:   She has no acute symptoms related to this.  I will be evaluating as below.  PALPITATIONS: It looks like she has atrial fibrillation.  I would like to get a 2-week event monitor to further evaluate as it was not entirely clear from her device and also to quantify how much of this she is having.  Right now her Susan Andrade has a CHA2DS2 - VASc score of 1 does not necessitate the need for anticoagulant.  I am going to give her as needed metoprolol in case she is particularly uncomfortable.  I will also be checking an echocardiogram and a TSH level.    Current medicines are reviewed at length with the patient today.  The patient does not have concerns regarding medicines.  The following changes have been made:  no change  Labs/  tests ordered today include:   Orders Placed This Encounter  Procedures   TSH   LONG TERM MONITOR (3-14 DAYS)   EKG 12-Lead   ECHOCARDIOGRAM COMPLETE     Disposition:   FU with me in two years.      Signed, Minus Breeding, MD  07/07/2020 5:18 PM    [facility]

## 2020-07-07 ENCOUNTER — Other Ambulatory Visit: Payer: Self-pay

## 2020-07-07 ENCOUNTER — Encounter: Payer: Self-pay | Admitting: Cardiology

## 2020-07-07 ENCOUNTER — Telehealth: Payer: Self-pay | Admitting: Radiology

## 2020-07-07 ENCOUNTER — Ambulatory Visit (INDEPENDENT_AMBULATORY_CARE_PROVIDER_SITE_OTHER): Payer: BC Managed Care – PPO | Admitting: Cardiology

## 2020-07-07 VITALS — BP 123/74 | HR 114 | Temp 98.1°F | Ht 65.0 in | Wt 230.4 lb

## 2020-07-07 DIAGNOSIS — R002 Palpitations: Secondary | ICD-10-CM | POA: Diagnosis not present

## 2020-07-07 DIAGNOSIS — Q249 Congenital malformation of heart, unspecified: Secondary | ICD-10-CM | POA: Diagnosis not present

## 2020-07-07 MED ORDER — METOPROLOL TARTRATE 25 MG PO TABS
12.5000 mg | ORAL_TABLET | Freq: Every day | ORAL | 2 refills | Status: DC | PRN
Start: 2020-07-07 — End: 2024-01-31

## 2020-07-07 NOTE — Patient Instructions (Addendum)
Medication Instructions:  BEGIN taking metoprolol tartrate 12.5 mg (half a tablet) as needed for palpitations *If you need a refill on your cardiac medications before your next appointment, please call your pharmacy*   Lab Work: TSH at the time of your echo If you have labs (blood work) drawn today and your tests are completely normal, you will receive your results only by:  [place] (if you have MyChart) OR  A paper copy in the mail If you have any lab test that is abnormal or we need to change your treatment, we will call you to review the results.   Testing/Procedures: Your physician has requested that you have an echocardiogram. Echocardiography is a painless test that uses sound waves to create images of your heart. It provides your doctor with information about the size and shape of your heart and how well your hearts chambers and valves are working. This procedure takes approximately one hour. There are no restrictions for this procedure.   ZIO XT- Long Term Monitor Instructions   Your physician has requested you wear your ZIO patch monitor_14______days.   This is a single patch monitor.  [employer] supplies one patch monitor per enrollment.  Additional stickers are not available.   Please do not apply patch if you will be having a Nuclear Stress Test, Echocardiogram, Cardiac CT, MRI, or Chest Xray during the time frame you would be wearing the monitor. The patch cannot be worn during these tests.  You cannot remove and re-apply the ZIO XT patch monitor.   Your ZIO patch monitor will be sent USPS Priority mail from [facility] directly to your home address. The monitor may also be mailed to a PO BOX if home delivery is not available.   It may take 3-5 days to receive your monitor after you have been enrolled.   Once you have received you monitor, please review enclosed instructions.  Your monitor has already been registered assigning a specific monitor serial # to  you.   Applying the monitor   Shave hair from upper left chest.   Hold abrader disc by orange tab.  Rub abrader in 40 strokes over left upper chest as indicated in your monitor instructions.   Clean area with 4 enclosed alcohol pads .  Use all pads to assure are is cleaned thoroughly.  Let dry.   Apply patch as indicated in monitor instructions.  Patch will be place under collarbone on left side of chest with arrow pointing upward.   Rub patch adhesive wings for 2 minutes.Remove white label marked "1".  Remove white label marked "2".  Rub patch adhesive wings for 2 additional minutes.   While looking in a mirror, press and release button in center of patch.  A small green light will flash 3-4 times .  This will be your only indicator the monitor has been turned on.     Do not shower for the first 24 hours.  You may shower after the first 24 hours.   Press button if you feel a symptom. You will hear a small click.  Record Date, Time and Symptom in the Patient Log Book.   When you are ready to remove patch, follow instructions on last 2 pages of Patient Log Book.  Stick patch monitor onto last page of Patient Log Book.   Place Patient Log Book in [place] box.  Use locking tab on box and tape box closed securely.  The [place] and [employer] has [employer] on  it.  Please place in mailbox as soon as possible.  Your physician should have your test results approximately 7 days after the monitor has been mailed back to [facility].   Call [place] at [phone] if you have questions regarding your ZIO XT patch monitor.  Call them immediately if you see an orange light blinking on your monitor.   If your monitor falls off in less than 4 days contact our Monitor department at [phone].  If your monitor becomes loose or falls off after 4 days call [employer] at [phone] for suggestions on securing your monitor.     Follow-Up: At [facility], you and your health  needs are our priority.  As part of our continuing mission to provide you with exceptional heart care, we have created designated Provider Care Teams.  These Care Teams include your primary Cardiologist (physician) and Advanced Practice Providers (APPs -  Physician Assistants and Nurse Practitioners) who all work together to provide you with the care you need, when you need it.  We recommend signing up for the patient portal called "MyChart".  Sign up information is provided on this After Visit Summary.  MyChart is used to connect with patients for Virtual Visits (Telemedicine).  Patients are able to view lab/test results, encounter notes, upcoming appointments, etc.  Non-urgent messages can be sent to your provider as well.   To learn more about what you can do with MyChart, go to [email or web address].    Your next appointment:   1 month(s)  The format for your next appointment:   In Person  Provider:   Minus Breeding, MD   Other Instructions None

## 2020-07-07 NOTE — Telephone Encounter (Signed)
Enrolled patient for a 14 day Zio XT  monitor to be mailed to patients home  °

## 2020-07-08 ENCOUNTER — Telehealth: Payer: Self-pay | Admitting: Cardiology

## 2020-07-08 NOTE — Telephone Encounter (Signed)
Spoke with patient regarding appointment for Echo scheduled 07/10/20 at 4:05 pm--Lab work (TSH) scheduled 07/10/20 at 3:45 pm (both at the [facility] location) and a 1 month follow up with Dr. Ival Bible 08/06/20 at 2:40 pm.  Will mail information to patient and she voiced her understanding.

## 2020-07-10 ENCOUNTER — Ambulatory Visit (HOSPITAL_COMMUNITY): Payer: BC Managed Care – PPO | Attending: Internal Medicine

## 2020-07-10 ENCOUNTER — Other Ambulatory Visit (INDEPENDENT_AMBULATORY_CARE_PROVIDER_SITE_OTHER): Payer: BC Managed Care – PPO

## 2020-07-10 ENCOUNTER — Other Ambulatory Visit: Payer: BC Managed Care – PPO | Admitting: *Deleted

## 2020-07-10 ENCOUNTER — Other Ambulatory Visit: Payer: Self-pay

## 2020-07-10 DIAGNOSIS — Q249 Congenital malformation of heart, unspecified: Secondary | ICD-10-CM

## 2020-07-10 DIAGNOSIS — R002 Palpitations: Secondary | ICD-10-CM

## 2020-07-10 LAB — ECHOCARDIOGRAM COMPLETE
Area-P 1/2: 8.82 cm2
S' Lateral: 2.7 cm

## 2020-07-11 LAB — TSH: TSH: 0.689 u[IU]/mL (ref 0.450–4.500)

## 2020-08-05 NOTE — Progress Notes (Signed)
Cardiology Office Note   Date:  08/06/2020   ID:  Susan, Andrade [birth year], MRN [id number]  PCP:  Chesley Noon, MD  Cardiologist:   No primary care provider on file.  Chief Complaint  Patient presents with  . Palpitations      History of Present Illness: Susan Andrade is a 39 y.o. female who presents for follow up of  situs inversus totalis. I have not seen her since 2015 .  She was last seen in 2017 and had a pulsatile feeling in her neck but there was no clear reason for this.  She had a follow up CT.   She had a normal echo aside from the situs inversus.      She called recently with palpitations.   There were no significant abnormalities on echo other than situs.  Since I last saw her she has had no further episodes that she described previously.  She wore a monitor and I went over this with her in details.  There were no arrhythmias.  She had sinus rhythm.  There was some sinus tachycardia.  One of the episodes of sinus tachycardia happened at about 9:30 in the morning on his workday and she says she was not exercising or doing anything physically active at that time as she recalls.  This was a sinus tachycardia with rates in the 160s.  There is no onset or offset.  Her apple watch will alert her to the fact that she will occasionally have some rapid heart rates generally lasts for a few seconds at a time.  She does not have any presyncope or syncope since I last saw her.  She has had no chest discomfort, neck or arm discomfort.  She had no shortness of breath, PND orthopnea.  Past Medical History:  Diagnosis Date  . DM (diabetes mellitus) in pregnancy   . GBS (Guillain Barre syndrome) ([place])   . Hyperglycemia   . Leukocytoclastic vasculitis ([place])   . Polycystic ovary disease   . Retinal hemorrhage   . Sinusitis-bronchiectasis-situs inversus syndrome   . 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  CESAREAN SECTION     2010  . DILATION AND EVACUATION N/A 10/10/2012   Procedure: DILATATION AND EVACUATION;  Surgeon: Cyril Mourning, MD;  Location: [facility];  Service: Gynecology;  Laterality: N/A;  . DILATION AND EVACUATION N/A 10/13/2012   Procedure: DILATATION AND EVACUATION UNDER ULTRASOUND GUIDENCE;  Surgeon: Marylynn Pearson, MD;  Location: [facility];  Service: Gynecology;  Laterality: N/A;     Current Outpatient Medications  Medication Sig Dispense Refill  . Ascorbic Acid (VITAMIN C PO) Take by mouth.    Marland Kitchen azelastine (ASTELIN) 0.1 % nasal spray Place into both nostrils 2 (two) times daily. Use in each nostril as directed    . cholecalciferol (VITAMIN D) 1000 units tablet Vitamin D3    . FISH OIL-VITAMIN D PO Take by mouth.    . metoprolol tartrate (LOPRESSOR) 25 MG tablet Take 0.5 tablets (12.5 mg total) by mouth daily as needed (Palpitations). 30 tablet 2  . Probiotic Product (PROBIOTIC PO) Take by mouth.    . mometasone (NASONEX) 50 MCG/ACT nasal spray Place 2 sprays into the nose daily. ALLERGIES     No current facility-administered medications for this visit.    Allergies:   Pseudoephedrine hcl, Sulfonamide derivatives, Amoxicillin, Latex, Penicillins, and Sudafed [pseudoephedrine]  ROS:  Please see the history of present illness.   Otherwise, review of systems are positive for none.   All other systems are reviewed and negative.    PHYSICAL EXAM: VS:  BP 120/89   Pulse (!) 112   Temp 98.4 F (36.9 C)   Ht 5\' 5"  (1.651 m)   Wt 230 lb 6.4 oz (104.5 kg)   SpO2 97%   BMI 38.34 kg/m  , BMI Body mass index is 38.34 kg/m. GENERAL:  Well appearing NECK:  No jugular venous distention, waveform within normal limits, carotid upstroke brisk and symmetric, no bruits, no thyromegaly LUNGS:  Clear to auscultation bilaterally CHEST:  Unremarkable HEART:  PMI right sided or sustained,S1 and S2 within normal limits, no S3, no S4, no clicks, no rubs, no murmurs ABD:  Flat, positive  bowel sounds normal in frequency in pitch, no bruits, no rebound, no guarding, no midline pulsatile mass, no hepatomegaly, no splenomegaly EXT:  2 plus pulses throughout, no edema, no cyanosis no clubbing    EKG:  EKG is not ordered today.   Recent Labs: 07/02/2020: ALT 18; BUN 14; Creatinine, Ser 0.60; Hemoglobin 12.1; Platelets 324; Potassium 4.2; Sodium 139 07/10/2020: TSH 0.689     Wt Readings from Last 3 Encounters:  08/06/20 230 lb 6.4 oz (104.5 kg)  07/07/20 230 lb 6.4 oz (104.5 kg)  07/02/20 225 lb (102.1 kg)      Other studies Reviewed: Additional studies/ records that were reviewed today include: None. Review of the above records demonstrates:  Please see elsewhere in the note.     ASSESSMENT AND PLAN:  SITUS INVERSUS:     She had a normal otherwise structural heart on echo.  No change in therapy.   PALPITATIONS:      We had a long discussion about this.  She is going to upgrade to a newer version Apple Watch .  For now she would not want to take any therapies for this unless it is more sustained.   Current medicines are reviewed at length with the patient today.  The patient does not have concerns regarding medicines.  The following changes have been made:  None  Labs/ tests ordered today include: None  No orders of the defined types were placed in this encounter.    Disposition:   FU with me as needed.   Signed, Minus Breeding, MD  08/06/2020 4:44 PM    [facility]

## 2020-08-06 ENCOUNTER — Encounter: Payer: Self-pay | Admitting: Cardiology

## 2020-08-06 ENCOUNTER — Other Ambulatory Visit: Payer: Self-pay

## 2020-08-06 ENCOUNTER — Ambulatory Visit (INDEPENDENT_AMBULATORY_CARE_PROVIDER_SITE_OTHER): Payer: BC Managed Care – PPO | Admitting: Cardiology

## 2020-08-06 VITALS — BP 120/89 | HR 112 | Temp 98.4°F | Ht 65.0 in | Wt 230.4 lb

## 2020-08-06 DIAGNOSIS — R002 Palpitations: Secondary | ICD-10-CM | POA: Diagnosis not present

## 2020-08-06 DIAGNOSIS — Q249 Congenital malformation of heart, unspecified: Secondary | ICD-10-CM | POA: Diagnosis not present

## 2020-08-06 NOTE — Patient Instructions (Signed)
Medication Instructions:   No changes *If you need a refill on your cardiac medications before your next appointment, please call your pharmacy*  Lab Work: None ordered this visit  Testing/Procedures: None ordered this visit  Follow-Up: At [facility], you and your health needs are our priority.  As part of our continuing mission to provide you with exceptional heart care, we have created designated Provider Care Teams.  These Care Teams include your primary Cardiologist (physician) and Advanced Practice Providers (APPs -  Physician Assistants and Nurse Practitioners) who all work together to provide you with the care you need, when you need it.   Your next appointment:   Follow up as needed

## 2023-06-14 ENCOUNTER — Other Ambulatory Visit: Payer: Self-pay | Admitting: Obstetrics and Gynecology

## 2023-06-14 DIAGNOSIS — R9230 Dense breasts, unspecified: Secondary | ICD-10-CM

## 2023-07-27 ENCOUNTER — Other Ambulatory Visit: Payer: Self-pay | Admitting: Obstetrics and Gynecology

## 2023-07-27 DIAGNOSIS — R9230 Dense breasts, unspecified: Secondary | ICD-10-CM

## 2023-07-30 ENCOUNTER — Other Ambulatory Visit: Payer: BC Managed Care – PPO

## 2023-07-30 ENCOUNTER — Ambulatory Visit
Admission: RE | Admit: 2023-07-30 | Discharge: 2023-07-30 | Disposition: A | Discharge status: Home / Self Care | Payer: BC Managed Care – PPO | Source: Ambulatory Visit | Attending: Obstetrics and Gynecology | Admitting: Obstetrics and Gynecology

## 2023-07-30 DIAGNOSIS — R9230 Dense breasts, unspecified: Secondary | ICD-10-CM

## 2023-07-30 MED ORDER — GADOPICLENOL 0.5 MMOL/ML IV SOLN
10.0000 mL | Freq: Once | INTRAVENOUS | Status: AC | PRN
  Administered 2023-07-30: 10 mL via INTRAVENOUS

## 2024-01-28 NOTE — Progress Notes (Unsigned)
  Cardiology Office Note:   Date:  01/28/2024  ID:  Susan Andrade, DOB 12/07/1980, MRN [id number] PCP: Emaline Handsome, MD  [facility] Providers Cardiologist:  None {  History of Present Illness:   Susan Andrade is a 43 y.o. female who presents for follow up of  situs inversus totalis. I have not seen her since 2015 .  She was last seen in 2017 and had a pulsatile feeling in her neck but there was no clear reason for this.  She had a follow up CT.   She had a normal echo aside from the situs inversus.      ***   ***  She called recently with palpitations.   There were no significant abnormalities on echo other than situs.  Since I last saw her she has had no further episodes that she described previously.  She wore a monitor and I went over this with her in details.  There were no arrhythmias.  She had sinus rhythm.  There was some sinus tachycardia.  One of the episodes of sinus tachycardia happened at about 9:30 in the morning on his workday and she says she was not exercising or doing anything physically active at that time as she recalls.  This was a sinus tachycardia with rates in the 160s.  There is no onset or offset.  Her apple watch will alert her to the fact that she will occasionally have some rapid heart rates generally lasts for a few seconds at a time.  She does not have any presyncope or syncope since I last saw her.  She has had no chest discomfort, neck or arm discomfort.  She had no shortness of breath, PND orthopnea.  ROS: ***  Studies Reviewed:    EKG:       ***  Risk Assessment/Calculations:   {Does this patient have ATRIAL FIBRILLATION?[phone]} No BP recorded.  {Refresh Note OR Click here to enter BP  :1}***        Physical Exam:   VS:  There were no vitals taken for this visit.   Wt Readings from Last 3 Encounters:  08/06/20 230 lb 6.4 oz (104.5 kg)  07/07/20 230 lb 6.4 oz (104.5 kg)  07/02/20 225 lb (102.1 kg)     GEN: Well nourished, well  developed in no acute distress NECK: No JVD; No carotid bruits CARDIAC: ***RR, *** murmurs, rubs, gallops RESPIRATORY:  Clear to auscultation without rales, wheezing or rhonchi  ABDOMEN: Soft, non-tender, non-distended EXTREMITIES:  No edema; No deformity   ASSESSMENT AND PLAN:   SITUS INVERSUS:    ***   She had a normal otherwise structural heart on echo.  No change in therapy.    PALPITATIONS:     ***   We had a long discussion about this.  She is going to upgrade to a newer version Apple Watch .  For now she would not want to take any therapies for this unless it is more sustained.     Follow up ***  Signed, Eilleen Grates, MD

## 2024-01-31 ENCOUNTER — Encounter: Payer: Self-pay | Admitting: Cardiology

## 2024-01-31 ENCOUNTER — Ambulatory Visit: Attending: Cardiology | Admitting: Cardiology

## 2024-01-31 VITALS — BP 124/78 | HR 92 | Ht 65.0 in | Wt 215.8 lb

## 2024-01-31 DIAGNOSIS — Q893 Situs inversus: Secondary | ICD-10-CM

## 2024-01-31 DIAGNOSIS — R002 Palpitations: Secondary | ICD-10-CM | POA: Diagnosis not present

## 2024-01-31 MED ORDER — METOPROLOL TARTRATE 25 MG PO TABS: 12.5000 mg | ORAL_TABLET | Freq: Every day | ORAL | 3 refills | Status: AC | PRN

## 2024-01-31 NOTE — Patient Instructions (Signed)
 Medication Instructions:  Your physician recommends that you continue on your current medications as directed. Please refer to the Current Medication list given to you today.  *If you need a refill on your cardiac medications before your next appointment, please call your pharmacy*  Lab Work: NONE If you have labs (blood work) drawn today and your tests are completely normal, you will receive your results only by: MyChart Message (if you have MyChart) OR A paper copy in the mail If you have any lab test that is abnormal or we need to change your treatment, we will call you to review the results.  Testing/Procedures: NONE  Follow-Up: At [facility], you and your health needs are our priority.  As part of our continuing mission to provide you with exceptional heart care, our providers are all part of one team.  This team includes your primary Cardiologist (physician) and Advanced Practice Providers or APPs (Physician Assistants and Nurse Practitioners) who all work together to provide you with the care you need, when you need it.  Your next appointment:   2 year(s)  Provider:   Lavonne Prairie, MD  We recommend signing up for the patient portal called MyChart.  Sign up information is provided on this After Visit Summary.  MyChart is used to connect with patients for Virtual Visits (Telemedicine).  Patients are able to view lab/test results, encounter notes, upcoming appointments, etc.  Non-urgent messages can be sent to your provider as well.   To learn more about what you can do with MyChart, go to [email or web address].
